# Patient Record
Sex: Female | Born: 1985
Health system: Southern US, Community
[De-identification: ages and names within clinical notes are randomized; demographics above are authoritative.]

## PROBLEM LIST (undated history)

## (undated) DIAGNOSIS — K219 Gastro-esophageal reflux disease without esophagitis: Secondary | ICD-10-CM

## (undated) DIAGNOSIS — K8050 Calculus of bile duct without cholangitis or cholecystitis without obstruction: Secondary | ICD-10-CM

## (undated) DIAGNOSIS — R1011 Right upper quadrant pain: Secondary | ICD-10-CM

## (undated) DIAGNOSIS — R7989 Other specified abnormal findings of blood chemistry: Secondary | ICD-10-CM

## (undated) DIAGNOSIS — D573 Sickle-cell trait: Secondary | ICD-10-CM

## (undated) DIAGNOSIS — U071 COVID-19: Secondary | ICD-10-CM

## (undated) DIAGNOSIS — R945 Abnormal results of liver function studies: Secondary | ICD-10-CM

## (undated) HISTORY — DX: Sickle-cell trait: D57.3

## (undated) HISTORY — DX: COVID-19: U07.1

## (undated) HISTORY — PX: NO PAST SURGERIES: SHX2092

## (undated) HISTORY — DX: Calculus of bile duct without cholangitis or cholecystitis without obstruction: K80.50

---

## 2015-06-23 LAB — HM PAP SMEAR: HM Pap smear: NEGATIVE

## 2015-11-27 ENCOUNTER — Encounter: Payer: Self-pay | Admitting: Family Medicine

## 2015-11-27 ENCOUNTER — Ambulatory Visit (INDEPENDENT_AMBULATORY_CARE_PROVIDER_SITE_OTHER): Payer: BLUE CROSS/BLUE SHIELD | Admitting: Family Medicine

## 2015-11-27 VITALS — BP 103/70 | HR 69 | Temp 98.3°F | Resp 100 | Ht 65.4 in | Wt 145.0 lb

## 2015-11-27 DIAGNOSIS — Z23 Encounter for immunization: Secondary | ICD-10-CM

## 2015-11-27 DIAGNOSIS — Z3041 Encounter for surveillance of contraceptive pills: Secondary | ICD-10-CM | POA: Diagnosis not present

## 2015-11-27 NOTE — Assessment & Plan Note (Signed)
Doing well on the pill. Would like to continue with it. Doesn't remember the name of it. Coming back this afternoon with her son, will tell us the name of her OCP at that time and we will give her refills. Continue to monitor.

## 2015-11-27 NOTE — Progress Notes (Signed)
BP 103/70 mmHg  Pulse 69  Temp(Src) 98.3 F (36.8 C)  Resp 100  Ht 5' 5.4" (1.661 m)  Wt 145 lb (65.772 kg)  BMI 23.84 kg/m2  LMP 10/27/2015 (Approximate)   Subjective:    Patient ID: Dawn Morgan, female    DOB: 05/09/1986, 30 y.o.   MRN: [id number]  HPI: Dawn Morgan is a 30 y.o. female who presents today to establish care.   Chief Complaint  Patient presents with  . Establish Care   CONTRACEPTION CONCERNS Contraception:  pill Previous contraception: only pill  Sexual activity: monagamous Gravida/Para: G3P3 Menarche at age: 35/13 Average interval between menses: 28 between  Length of menses: 4-5 days Flow: moderate Dysmenorrhea: yes  Last physical last year, unsure when her last pap was  Active Ambulatory Problems    Diagnosis Date Noted  . Encounter for surveillance of contraceptive pills 11/27/2015   Resolved Ambulatory Problems    Diagnosis Date Noted  . No Resolved Ambulatory Problems   No Additional Past Medical History   No Known Allergies  History reviewed. No pertinent past surgical history.  Family History  Problem Relation Age of Onset  . Diabetes Father   . Arthritis Maternal Grandmother    Social History   Social History  . Marital Status: Married    Spouse Name: N/A  . Number of Children: N/A  . Years of Education: N/A   Social History Main Topics  . Smoking status: Never Smoker   . Smokeless tobacco: Never Used  . Alcohol Use: Yes     Comment: On occasion  . Drug Use: No  . Sexual Activity: Yes    Birth Control/ Protection: Pill   Other Topics Concern  . None   Social History Narrative  . None    Review of Systems  Constitutional: Negative.   HENT: Negative.   Respiratory: Negative.   Cardiovascular: Negative.   Psychiatric/Behavioral: Negative.     Per HPI unless specifically indicated above     Objective:    BP 103/70 mmHg  Pulse 69  Temp(Src) 98.3 F (36.8 C)  Resp 100  Ht 5' 5.4" (1.661 m)  Wt 145 lb  (65.772 kg)  BMI 23.84 kg/m2  LMP 10/27/2015 (Approximate)  Wt Readings from Last 3 Encounters:  11/27/15 145 lb (65.772 kg)    Physical Exam  Constitutional: She is oriented to person, place, and time. She appears well-developed and well-nourished. No distress.  HENT:  Head: Normocephalic and atraumatic.  Right Ear: Hearing normal.  Left Ear: Hearing normal.  Nose: Nose normal.  Eyes: Conjunctivae and lids are normal. Right eye exhibits no discharge. Left eye exhibits no discharge. No scleral icterus.  Cardiovascular: Normal rate, regular rhythm, normal heart sounds and intact distal pulses.  Exam reveals no gallop and no friction rub.   No murmur heard. Pulmonary/Chest: Effort normal and breath sounds normal. No respiratory distress. She has no wheezes. She has no rales. She exhibits no tenderness.  Musculoskeletal: Normal range of motion.  Neurological: She is alert and oriented to person, place, and time.  Skin: Skin is warm, dry and intact. No rash noted. No erythema. No pallor.  Psychiatric: She has a normal mood and affect. Her speech is normal and behavior is normal. Judgment and thought content normal. Cognition and memory are normal.  Nursing note and vitals reviewed.   No results found for this or any previous visit.    Assessment & Plan:   Problem List Items Addressed This Visit  Other   Encounter for surveillance of contraceptive pills - Primary    Doing well on the pill. Would like to continue with it. Doesn't remember the name of it. Coming back this afternoon with her son, will tell us the name of her OCP at that time and we will give her refills. Continue to monitor.        Other Visit Diagnoses    Immunization due        Flu shot given today.    Relevant Orders    Flu Vaccine QUAD 36+ mos PF IM (Fluarix & Fluzone Quad PF) (Completed)        Follow up plan: Return in about 6 months (around 05/26/2016) for Physical- records release please  .

## 2015-11-28 MED ORDER — LEVONORGESTREL-ETHINYL ESTRAD 0.1-20 MG-MCG PO TABS: 1.0000 | ORAL_TABLET | Freq: Every day | ORAL | Status: DC

## 2015-11-28 NOTE — Addendum Note (Signed)
Addended by: Dorcas Carrow on: 11/28/2015 04:27 PM   Modules accepted: Orders

## 2015-11-30 ENCOUNTER — Encounter: Payer: Self-pay | Admitting: Family Medicine

## 2015-12-13 ENCOUNTER — Telehealth: Payer: Self-pay | Admitting: Family Medicine

## 2015-12-13 NOTE — Telephone Encounter (Signed)
Dawn Morgan: Please let patient know that she will need an appointment for that, or she can try over the counter acid reflux medication, and see if that helps before she schedules.

## 2015-12-13 NOTE — Telephone Encounter (Signed)
Pt is having pain in her stomach and she thinks it may be from acid reflux and she would like to have something called in to [facility].

## 2016-01-12 ENCOUNTER — Encounter: Payer: Self-pay | Admitting: Family Medicine

## 2016-01-12 ENCOUNTER — Ambulatory Visit (INDEPENDENT_AMBULATORY_CARE_PROVIDER_SITE_OTHER): Payer: BLUE CROSS/BLUE SHIELD | Admitting: Family Medicine

## 2016-01-12 ENCOUNTER — Ambulatory Visit: Payer: BLUE CROSS/BLUE SHIELD | Admitting: Family Medicine

## 2016-01-12 VITALS — BP 112/78 | HR 80 | Temp 98.7°F | Ht 65.6 in | Wt 145.0 lb

## 2016-01-12 DIAGNOSIS — K219 Gastro-esophageal reflux disease without esophagitis: Secondary | ICD-10-CM | POA: Diagnosis not present

## 2016-01-12 MED ORDER — OMEPRAZOLE 40 MG PO CPDR: 40.0000 mg | DELAYED_RELEASE_CAPSULE | Freq: Every day | ORAL | Status: DC

## 2016-01-12 NOTE — Assessment & Plan Note (Signed)
Will treat with omeprazole. Information given Call with any problems. Recheck 6 weeks.

## 2016-01-12 NOTE — Progress Notes (Signed)
BP 112/78 mmHg  Pulse 80  Temp(Src) 98.7 F (37.1 C)  Ht 5' 5.6" (1.666 m)  Wt 145 lb (65.772 kg)  BMI 23.70 kg/m2  SpO2 100%  LMP 12/25/2015 (Approximate)   Subjective:    Patient ID: Dawn Morgan, female    DOB: [birth year], 30 y.o.   MRN: [id number]  HPI: Dawn Morgan is a 30 y.o. female  Chief Complaint  Patient presents with  . Gastroesophageal Reflux    Patient states that she is having pain right below her breast in the center of her chest, she is also burping a lot. She has tried otc antiacids and they have not helped.   GERD, has been on and off, has been going on more recently GERD control status: exacerbated  Satisfied with current treatment? no Heartburn frequency: daily Medication side effects: no  Medication compliance: worse Previous GERD medications: Tums, antacid, zantac Antacid use frequency:  daily Duration: constant since Monday Nature: burning Location: Substernal Heartburn duration: days Alleviatiating factors:  nothing Aggravating factors: nothing Dysphagia: no Odynophagia:  no Hematemesis: no Blood in stool: no EGD: no  Relevant past medical, surgical, family and social history reviewed and updated as indicated. Interim medical history since our last visit reviewed. Allergies and medications reviewed and updated.  Review of Systems  Constitutional: Negative.   Respiratory: Negative.   Cardiovascular: Negative.   Gastrointestinal: Positive for abdominal pain. Negative for nausea, vomiting, diarrhea, constipation, blood in stool, abdominal distention, anal bleeding and rectal pain.  Psychiatric/Behavioral: Negative.     Per HPI unless specifically indicated above     Objective:    BP 112/78 mmHg  Pulse 80  Temp(Src) 98.7 F (37.1 C)  Ht 5' 5.6" (1.666 m)  Wt 145 lb (65.772 kg)  BMI 23.70 kg/m2  SpO2 100%  LMP 12/25/2015 (Approximate)  Wt Readings from Last 3 Encounters:  01/12/16 145 lb (65.772 kg)  11/27/15 145 lb (65.772 kg)     Physical Exam  Constitutional: She is oriented to person, place, and time. She appears well-developed and well-nourished. No distress.  HENT:  Head: Normocephalic and atraumatic.  Right Ear: Hearing normal.  Left Ear: Hearing normal.  Nose: Nose normal.  Eyes: Conjunctivae and lids are normal. Right eye exhibits no discharge. Left eye exhibits no discharge. No scleral icterus.  Pulmonary/Chest: Effort normal. No respiratory distress.  Abdominal: Soft. She exhibits no distension and no mass. There is tenderness in the epigastric area. There is no rebound and no guarding.  Musculoskeletal: Normal range of motion.  Neurological: She is alert and oriented to person, place, and time.  Skin: Skin is warm, dry and intact. No rash noted. She is not diaphoretic. No erythema. No pallor.  Psychiatric: She has a normal mood and affect. Her speech is normal and behavior is normal. Judgment and thought content normal. Cognition and memory are normal.  Nursing note and vitals reviewed.   Results for orders placed or performed in visit on 11/30/15  HM PAP SMEAR  Result Value Ref Range   HM Pap smear Neg Pap with Neg HPV       Assessment & Plan:   Problem List Items Addressed This Visit      Digestive   GERD (gastroesophageal reflux disease) - Primary    Will treat with omeprazole. Information given Call with any problems. Recheck 6 weeks.       Relevant Medications   omeprazole (PRILOSEC) 40 MG capsule       Follow up plan: Return in  about 6 weeks (around 02/23/2016) for Recheck GERD.

## 2016-01-12 NOTE — Patient Instructions (Signed)
Food Choices for Gastroesophageal Reflux Disease, Adult When you have gastroesophageal reflux disease (GERD), the foods you eat and your eating habits are very important. Choosing the right foods can help ease the discomfort of GERD. WHAT GENERAL GUIDELINES DO I NEED TO FOLLOW?  Choose fruits, vegetables, whole grains, low-fat dairy products, and low-fat meat, fish, and poultry.  Limit fats such as oils, salad dressings, butter, nuts, and avocado.  Keep a food diary to identify foods that cause symptoms.  Avoid foods that cause reflux. These may be different for different people.  Eat frequent small meals instead of three large meals each day.  Eat your meals slowly, in a relaxed setting.  Limit fried foods.  Cook foods using methods other than frying.  Avoid drinking alcohol.  Avoid drinking large amounts of liquids with your meals.  Avoid bending over or lying down until 2-3 hours after eating. WHAT FOODS ARE NOT RECOMMENDED? The following are some foods and drinks that may worsen your symptoms: Vegetables Tomatoes. Tomato juice. Tomato and spaghetti sauce. Chili peppers. Onion and garlic. Horseradish. Fruits Oranges, grapefruit, and lemon (fruit and juice). Meats High-fat meats, fish, and poultry. This includes hot dogs, ribs, ham, sausage, salami, and bacon. Dairy Whole milk and chocolate milk. Sour cream. Cream. Butter. Ice cream. Cream cheese.  Beverages Coffee and tea, with or without caffeine. Carbonated beverages or energy drinks. Condiments Hot sauce. Barbecue sauce.  Sweets/Desserts Chocolate and cocoa. Donuts. Peppermint and spearmint. Fats and Oils High-fat foods, including French fries and potato chips. Other Vinegar. Strong spices, such as black pepper, white pepper, red pepper, cayenne, curry powder, cloves, ginger, and chili powder. The items listed above may not be a complete list of foods and beverages to avoid. Contact your dietitian for more  information.   This information is not intended to replace advice given to you by your health care provider. Make sure you discuss any questions you have with your health care provider.   Document Released: 09/23/2005 Document Revised: 10/14/2014 Document Reviewed: 07/28/2013 Elsevier Interactive Patient Education 2016 Elsevier Inc.  

## 2016-02-20 ENCOUNTER — Encounter: Payer: Self-pay | Admitting: Unknown Physician Specialty

## 2016-02-20 ENCOUNTER — Ambulatory Visit (INDEPENDENT_AMBULATORY_CARE_PROVIDER_SITE_OTHER): Payer: BLUE CROSS/BLUE SHIELD | Admitting: Unknown Physician Specialty

## 2016-02-20 VITALS — BP 105/71 | HR 76 | Temp 97.9°F | Ht 66.5 in | Wt 143.2 lb

## 2016-02-20 DIAGNOSIS — R17 Unspecified jaundice: Secondary | ICD-10-CM

## 2016-02-20 DIAGNOSIS — B360 Pityriasis versicolor: Secondary | ICD-10-CM

## 2016-02-20 NOTE — Patient Instructions (Signed)
Buy Clotrimazole cream OTC for rash

## 2016-02-20 NOTE — Progress Notes (Signed)
BP 105/71 mmHg  Pulse 76  Temp(Src) 97.9 F (36.6 C)  Ht 5' 6.5" (1.689 m)  Wt 143 lb 3.2 oz (64.955 kg)  BMI 22.77 kg/m2  SpO2 99%  LMP 02/02/2016 (Approximate)   Subjective:    Patient ID: Dawn Morgan, female    DOB: 1985/11/04, 30 y.o.   MRN: [id number]  HPI: Dawn NovemberLaure Quinton is a 30 y.o. female  Chief Complaint  Patient presents with  . Gastroesophageal Reflux    pt states she has been having trouble with GERD off and on for a while but for the last week it has been consistant, states the omeprazole is not really helping   Pt is here to f/u form last visit in which she was put back on Omeprazole.  She is finding this is not helping.  She is working on diet and has a low appetite.  She is having intermittent abdominal pain, sometimes feels severe.  Points to RUQ and epigastric areas.  Some nausea and decreased appetite.  States she feels tired.  Reports urine is more yellow with clay colored stools.  No recent travel or sexual contacts.  Eats out about once a week.    Relevant past medical, surgical, family and social history reviewed and updated as indicated. Interim medical history since our last visit reviewed. Allergies and medications reviewed and updated.  Review of Systems  Constitutional: Positive for fatigue.  Respiratory: Negative.   Cardiovascular: Negative.   Gastrointestinal:       Clay colored stools  Skin: Negative.        Spots on back that are new and itch  Psychiatric/Behavioral: Negative.     Per HPI unless specifically indicated above     Objective:    BP 105/71 mmHg  Pulse 76  Temp(Src) 97.9 F (36.6 C)  Ht 5' 6.5" (1.689 m)  Wt 143 lb 3.2 oz (64.955 kg)  BMI 22.77 kg/m2  SpO2 99%  LMP 02/02/2016 (Approximate)  Wt Readings from Last 3 Encounters:  02/20/16 143 lb 3.2 oz (64.955 kg)  01/12/16 145 lb (65.772 kg)  11/27/15 145 lb (65.772 kg)    Physical Exam  Constitutional: She is oriented to person, place, and time. She appears  well-developed and well-nourished. No distress.  HENT:  Head: Normocephalic and atraumatic.  Eyes: Conjunctivae and lids are normal. Right eye exhibits no discharge. Left eye exhibits no discharge. Scleral icterus is present.  Neck: Normal range of motion. Neck supple. No JVD present. Carotid bruit is not present.  Cardiovascular: Normal rate, regular rhythm and normal heart sounds.   Pulmonary/Chest: Effort normal and breath sounds normal.  Abdominal: Soft. Normal appearance. There is hepatomegaly. There is no splenomegaly. There is tenderness in the left upper quadrant.  Musculoskeletal: Normal range of motion.  Neurological: She is alert and oriented to person, place, and time.  Skin: Skin is warm, dry and intact. No pallor.  Irregular darkened patches on back  Psychiatric: She has a normal mood and affect. Her behavior is normal. Judgment and thought content normal.      Assessment & Plan:   Problem List Items Addressed This Visit    None    Visit Diagnoses    Jaundice of recent onset    -  Primary    Get abdominal US.  To GI ASAP.  Labs.  Discussed pt Dr. Dossie Arbourrissman.      Relevant Orders    Comprehensive metabolic panel    CBC with Differential/Platelet    UKorea  Abdomen Complete    Hepatic function panel    Amylase    Lipase    Hepatitis panel, acute    Ambulatory referral to Gastroenterology    Tinea versicolor        Rx for Clotrimazome cream        Follow up plan: Return for To GI ASAP.

## 2016-02-21 ENCOUNTER — Encounter: Payer: Self-pay | Admitting: Gastroenterology

## 2016-02-21 ENCOUNTER — Other Ambulatory Visit (INDEPENDENT_AMBULATORY_CARE_PROVIDER_SITE_OTHER): Payer: BLUE CROSS/BLUE SHIELD

## 2016-02-21 ENCOUNTER — Ambulatory Visit (INDEPENDENT_AMBULATORY_CARE_PROVIDER_SITE_OTHER): Payer: BLUE CROSS/BLUE SHIELD | Admitting: Gastroenterology

## 2016-02-21 VITALS — BP 90/60 | HR 80 | Ht 66.25 in | Wt 142.0 lb

## 2016-02-21 DIAGNOSIS — R945 Abnormal results of liver function studies: Principal | ICD-10-CM

## 2016-02-21 DIAGNOSIS — R1011 Right upper quadrant pain: Secondary | ICD-10-CM | POA: Diagnosis not present

## 2016-02-21 DIAGNOSIS — R7989 Other specified abnormal findings of blood chemistry: Secondary | ICD-10-CM

## 2016-02-21 LAB — HEPATIC FUNCTION PANEL
ALT: 437 U/L — AB (ref 0–35)
AST: 155 U/L — ABNORMAL HIGH (ref 0–37)
Albumin: 4.4 g/dL (ref 3.5–5.2)
Alkaline Phosphatase: 191 U/L — ABNORMAL HIGH (ref 39–117)
BILIRUBIN DIRECT: 0.8 mg/dL — AB (ref 0.0–0.3)
BILIRUBIN TOTAL: 1.8 mg/dL — AB (ref 0.2–1.2)
Bilirubin, Direct: 1.17 mg/dL — ABNORMAL HIGH (ref 0.00–0.40)
Total Protein: 7.6 g/dL (ref 6.0–8.3)

## 2016-02-21 LAB — CBC WITH DIFFERENTIAL/PLATELET
BASOS: 1 %
Basophils Absolute: 0 10*3/uL (ref 0.0–0.2)
EOS (ABSOLUTE): 0.1 10*3/uL (ref 0.0–0.4)
EOS: 1 %
HEMATOCRIT: 35.2 % (ref 34.0–46.6)
HEMOGLOBIN: 11.2 g/dL (ref 11.1–15.9)
Immature Grans (Abs): 0 10*3/uL (ref 0.0–0.1)
Immature Granulocytes: 0 %
LYMPHS ABS: 1.1 10*3/uL (ref 0.7–3.1)
Lymphs: 27 %
MCH: 26.5 pg — AB (ref 26.6–33.0)
MCHC: 31.8 g/dL (ref 31.5–35.7)
MCV: 83 fL (ref 79–97)
MONOCYTES: 7 %
MONOS ABS: 0.3 10*3/uL (ref 0.1–0.9)
NEUTROS ABS: 2.7 10*3/uL (ref 1.4–7.0)
Neutrophils: 64 %
Platelets: 340 10*3/uL (ref 150–379)
RBC: 4.22 x10E6/uL (ref 3.77–5.28)
RDW: 15.3 % (ref 12.3–15.4)
WBC: 4.2 10*3/uL (ref 3.4–10.8)

## 2016-02-21 LAB — COMPREHENSIVE METABOLIC PANEL
ALBUMIN: 4.1 g/dL (ref 3.5–5.5)
ALK PHOS: 226 IU/L — AB (ref 39–117)
ALT: 515 IU/L (ref 0–32)
AST: 247 IU/L — ABNORMAL HIGH (ref 0–40)
Albumin/Globulin Ratio: 1.5 (ref 1.2–2.2)
BUN / CREAT RATIO: 14 (ref 9–23)
BUN: 11 mg/dL (ref 6–20)
Bilirubin Total: 2.2 mg/dL — ABNORMAL HIGH (ref 0.0–1.2)
CO2: 24 mmol/L (ref 18–29)
CREATININE: 0.81 mg/dL (ref 0.57–1.00)
Calcium: 9.2 mg/dL (ref 8.7–10.2)
Chloride: 101 mmol/L (ref 96–106)
GFR, EST AFRICAN AMERICAN: 112 mL/min/{1.73_m2} (ref >59)
GFR, EST NON AFRICAN AMERICAN: 97 mL/min/{1.73_m2} (ref >59)
GLOBULIN, TOTAL: 2.8 g/dL (ref 1.5–4.5)
GLUCOSE: 87 mg/dL (ref 65–99)
Potassium: 4.7 mmol/L (ref 3.5–5.2)
SODIUM: 140 mmol/L (ref 134–144)
TOTAL PROTEIN: 6.9 g/dL (ref 6.0–8.5)

## 2016-02-21 LAB — HEPATITIS PANEL, ACUTE
HEP B C IGM: NEGATIVE
HEP B S AG: NEGATIVE
HEP C VIRUS AB: 0.1 {s_co_ratio} (ref 0.0–0.9)
Hep A IgM: NEGATIVE

## 2016-02-21 LAB — LIPASE: Lipase: 85 U/L — ABNORMAL HIGH (ref 0–59)

## 2016-02-21 LAB — PROTIME-INR
INR: 1 ratio (ref 0.8–1.0)
PROTHROMBIN TIME: 10.2 s (ref 9.6–13.1)

## 2016-02-21 LAB — AMYLASE: AMYLASE: 102 U/L (ref 31–124)

## 2016-02-21 NOTE — Progress Notes (Signed)
Reviewed and agree with initial management plan.  Christion Leonhard T. Eldine Rencher, MD FACG 

## 2016-02-21 NOTE — Patient Instructions (Signed)
Please go to the basement level to have your labs drawn.  You have been scheduled for an abdominal ultrasound [facility]  Tomorrow 5-18  at 10:30 am. Please arrive at 10: 15 minutes prior to your appointment for registration. Make certain not to have anything to eat or drink after midnight  prior to your appointment. Should you need to reschedule your appointment, please contact radiology at [phone]. This test typically takes about 30 minutes to perform.

## 2016-02-21 NOTE — Progress Notes (Signed)
02/21/2016 Stephens NovemberLaure Morgan [id number] 1986/05/08   HISTORY OF PRESENT ILLNESS:  This is a very pleasant 30 year old female who is here today with her husband at the request of her PCP, Gabriel Cirriheryl Wicker NP, for evaluation of abdominal pain with associated elevated liver function tests.  She tells me that last week she developed intense epigastric and right upper quadrant abdominal pain. She had a few episodes of nausea and vomiting as well. She thought that this was acid reflux because she had a couple of similar episodes in the past that were contributed to acid reflux. When she developed this pain last week it was very intense so she saw her PCP finally yesterday, however, the pain was already improved. CBC, amylase, electrolytes, and renal function were all normal.  LFTs are elevated with a total bilirubin of 2.2, alkaline phosphatase 226, AST 247, ALT 515. Scheduled for an ultrasound next week. She tells me that today her upper abdomen is just sore, but nowhere near the intensity that it was last week. Once again, she's had another episode about a month ago and one last year that felt very similar. She was recently started on omeprazole 40 mg daily.  Viral hepatitis studies were negative. She denies any recent travel. She denies any use of herbals, supplements, vitamin/minerals, etc.  Past Medical History  Diagnosis Date  . Sickle cell trait (HCC)   . Anemia in pregnancy   . Iron deficiency anemia    Past Surgical History  Procedure Laterality Date  . Neg hx      reports that she has never smoked. She has never used smokeless tobacco. She reports that she drinks alcohol. She reports that she does not use illicit drugs. family history includes Arthritis in her maternal grandmother; Diabetes in her father. There is no history of Colon cancer. No Known Allergies    Outpatient Encounter Prescriptions as of 02/21/2016  Medication Sig  . levonorgestrel-ethinyl estradiol (AVIANE,ALESSE,LESSINA)  0.1-20 MG-MCG tablet Take 1 tablet by mouth daily.  Marland Kitchen. omeprazole (PRILOSEC) 40 MG capsule Take 1 capsule (40 mg total) by mouth daily.   No facility-administered encounter medications on file as of 02/21/2016.     REVIEW OF SYSTEMS  : All other systems reviewed and negative except where noted in the History of Present Illness.   PHYSICAL EXAM: BP 90/60 mmHg  Pulse 80  Ht 5' 6.25" (1.683 m)  Wt 142 lb (64.411 kg)  BMI 22.74 kg/m2  LMP 02/02/2016 (Approximate) General: Well developed black female in no acute distress Head: Normocephalic and atraumatic Eyes:  Sclerae anicteric, conjunctiva pink. Ears: Normal auditory acuity Lungs: Clear throughout to auscultation Heart: Regular rate and rhythm Abdomen: Soft, non-distended.  Normal bowel sounds.  Mild epigastric and RUQ TTP. Musculoskeletal: Symmetrical with no gross deformities  Skin: No lesions on visible extremities Extremities: No edema  Neurological: Alert oriented x 4, grossly non-focal Psychological:  Alert and cooperative. Normal mood and affect  ASSESSMENT AND PLAN: -30 year old female with elevated LFTs and complaints of epigastric/right upper quadrant abdominal pain. Pain now improved.  She's had episodes of similar pain in the past as well. My thoughts are that she likely has gallstones and could be passing stones intermittently. Acute viral hepatitis panel is negative. She has no other associated viral symptoms that would prompt concern for EBV or CMV. She is scheduled for ultrasound next week, but we will see if we can get that done sooner.  We will recheck a hepatic function panel today along  with a PT/INR and autoimmune hepatitis studies, although I feel that this is less likely a primary liver issue.  CC:  Dawn Perches P, DO

## 2016-02-22 ENCOUNTER — Telehealth: Payer: Self-pay | Admitting: *Deleted

## 2016-02-22 ENCOUNTER — Ambulatory Visit
Admission: RE | Admit: 2016-02-22 | Discharge: 2016-02-22 | Disposition: A | Discharge status: Home / Self Care | Payer: BLUE CROSS/BLUE SHIELD | Source: Ambulatory Visit | Attending: Gastroenterology | Admitting: Gastroenterology

## 2016-02-22 DIAGNOSIS — K8020 Calculus of gallbladder without cholecystitis without obstruction: Secondary | ICD-10-CM | POA: Diagnosis not present

## 2016-02-22 DIAGNOSIS — R7989 Other specified abnormal findings of blood chemistry: Secondary | ICD-10-CM | POA: Diagnosis not present

## 2016-02-22 DIAGNOSIS — R945 Abnormal results of liver function studies: Secondary | ICD-10-CM

## 2016-02-22 DIAGNOSIS — R1011 Right upper quadrant pain: Secondary | ICD-10-CM | POA: Diagnosis not present

## 2016-02-22 DIAGNOSIS — R938 Abnormal findings on diagnostic imaging of other specified body structures: Secondary | ICD-10-CM | POA: Diagnosis not present

## 2016-02-22 LAB — ANA: ANA: NEGATIVE

## 2016-02-22 NOTE — Telephone Encounter (Signed)
Called patient to inform her that Doug SouJessica Zehr PA spoke with one of the surgeons at [facility] and they made her an appointment to be seen in their office tomorrow 02-23-2016.  She is to arrive at 9:45 am. She will be seeing Dr. Magnus IvanKingsinger. I faxed them today the patient's demographics, labs, recent US results from yesterday, 5-17, and office notes from HallettJessica Zehr PA from 5-17.  I advise the doctor will review all this information and evaluate her situation. He will advise her of his plans.

## 2016-02-23 ENCOUNTER — Ambulatory Visit: Payer: BLUE CROSS/BLUE SHIELD | Admitting: Family Medicine

## 2016-02-23 ENCOUNTER — Ambulatory Visit: Payer: Self-pay | Admitting: General Surgery

## 2016-02-23 ENCOUNTER — Telehealth: Payer: Self-pay | Admitting: Gastroenterology

## 2016-02-23 DIAGNOSIS — K8050 Calculus of bile duct without cholangitis or cholecystitis without obstruction: Secondary | ICD-10-CM | POA: Diagnosis not present

## 2016-02-23 DIAGNOSIS — K8020 Calculus of gallbladder without cholecystitis without obstruction: Secondary | ICD-10-CM | POA: Diagnosis not present

## 2016-02-23 LAB — ANTI-SMOOTH MUSCLE ANTIBODY, IGG: SMOOTH MUSCLE AB: 40 U — AB (ref <20)

## 2016-02-23 LAB — MITOCHONDRIAL ANTIBODIES

## 2016-02-23 NOTE — H&P (Signed)
History of Present Illness Dawn Morgan(Dawn A. Kinsinger MD; 02/23/2016 11:38 AM) Patient words: gallbladder.  The patient is a 30 year old female who presents for evaluation of gall stones. Referred by Dawn SouJessica Morgan. Patient has a one-year history of intermittent epigastric abdominal pain associated with reflux. She's been on Prilosec for this for a long time with moderate control. In the past month she has had worsening abdominal pain in the specifically right upper quadrant. She does not associate this with any new foods but has tried to maintain a low acid and low spice diet. Last week she had very severe right upper quadrant abdominal pain and was referred to GI who underwent ultrasound showing large gallstones and a slightly dilated common bile duct. She initially had a weighted total bilirubin but that has not decreased. She also had nausea and vomiting multiple times as well as loss of appetite. In the last 5 days she has had much less abdominal pain and is not had much appetite but maintaining a bland diet.  She has had no other abdominal surgeries. She's had 3 pregnancies. She does not smoke.   Other Problems Dawn Morgan(Dawn Dockery, LPN; 4/09/81195/19/2017 14:7811:03 AM) Gastroesophageal Reflux Disease  Past Surgical History Dawn Morgan(Dawn Dockery, LPN; 2/95/62135/19/2017 08:6511:03 AM) No pertinent past surgical history  Diagnostic Studies History Dawn Morgan(Dawn Dockery, LPN; 7/84/69625/19/2017 95:2811:03 AM) Mammogram never Pap Smear 1-5 years ago  Allergies Dawn Morgan(Dawn Dockery, LPN; 4/13/24405/19/2017 10:2711:04 AM) No Known Drug Allergies05/19/2017  Medication History Dawn Morgan(Dawn Dockery, LPN; 2/53/66445/19/2017 03:4711:04 AM) Omeprazole (40MG  Capsule DR, Oral) Active. Orsythia (0.1-20MG -MCG Tablet, Oral) Active. Medications Reconciled  Pregnancy / Birth History Dawn Morgan(Dawn Dockery, LPN; 4/25/95635/19/2017 87:5611:03 AM) Age at menarche 10 years. Contraceptive History Oral contraceptives. Maternal age 32-20    Review of Systems Dawn Morgan(Dawn A. Kinsinger MD; 02/23/2016 11:39 AM) General  Present- Appetite Loss. Not Present- Chills, Fatigue, Fever, Night Sweats, Weight Gain and Weight Loss. Skin Present- Jaundice. Not Present- Change in Wart/Mole, Dryness, Hives, New Lesions, Non-Healing Wounds, Rash and Ulcer. Neck Not Present- Neck Mass, Neck Pain and Neck Stiffness. Respiratory Not Present- Cough, Decreased Exercise Tolerance, Difficulty Breathing and Difficulty Breathing on Exertion. Cardiovascular Present- Chest Pain. Not Present- Difficulty Breathing Lying Down, Leg Cramps, Palpitations, Rapid Heart Rate, Shortness of Breath and Swelling of Extremities. Gastrointestinal Present- Abdominal Pain, Bloating and Indigestion. Not Present- Bloody Stool, Change in Bowel Habits, Chronic diarrhea, Constipation, Difficulty Swallowing, Excessive gas, Gets full quickly at meals, Hemorrhoids, Nausea, Rectal Pain and Vomiting. Musculoskeletal Not Present- Back Pain, Backache, Calf Pain and Claudication. Neurological Not Present- Decreased Memory, Difficulty Speaking, Dizziness, Dysarthria and Dysesthesia. Psychiatric Present- Change in Sleep Pattern. Not Present- Anxiety, Bipolar and Delirium. Hematology Not Present- Abnormal Bleeding, Anemia, Blood Clots and Blood Thinners.  Vitals Dawn Morgan(Dawn Dockery LPN; 4/33/29515/19/2017 88:4111:04 AM) 02/23/2016 11:04 AM Weight: 144.4 lb Height: 66in Body Surface Area: 1.74 m Body Mass Index: 23.31 kg/m  Temp.: 98.64F(Oral)  Pulse: 72 (Regular)  BP: 116/68 (Sitting, Left Arm, Standard)       Physical Exam Dawn Morgan(Dawn A Kinsinger, MD; 02/23/2016 11:40 AM) General Mental Status-Alert. General Appearance-Cooperative. Orientation-Oriented X4. Posture-Normal posture.  Integumentary Global Assessment Normal Exam - Head/Face: no rashes, ulcers, lesions or evidence of photo damage. No palpable nodules or masses and Neck: no visible lesions or palpable masses.  Head and Neck Head-normocephalic, atraumatic with no lesions or palpable  masses. Face Global Assessment - atraumatic. Thyroid Gland Characteristics - normal size and consistency.  Eye Eyeball - Bilateral-Extraocular movements intact. Sclera/Conjunctiva - Bilateral-No scleral icterus, No Discharge.  ENMT  Nose and Sinuses Nose - no deformities observed, no swelling present.  Chest and Lung Exam Palpation Normal exam - Non-tender. Auscultation Breath sounds - Normal.  Cardiovascular Auscultation Rhythm - Regular. Heart Sounds - S1 WNL and S2 WNL. Carotid arteries - No Carotid bruit.  Abdomen Inspection Normal Exam - No Visible peristalsis, No Abnormal pulsations and No Paradoxical movements. Palpation/Percussion Normal exam - Soft, No Rebound tenderness, No Rigidity (guarding), No hepatosplenomegaly and No Palpable abdominal masses. Tenderness - Right Upper Quadrant. Gallbladder - Negative Murphy's sign.  Peripheral Vascular Upper Extremity Palpation - Pulses bilaterally normal. Lower Extremity Palpation - Edema - Bilateral - No edema.  Neurologic Neurologic evaluation reveals -normal sensation and normal coordination.  Neuropsychiatric Mental status exam performed with findings of-able to articulate well with normal speech/language, rate, volume and coherence and thought content normal with ability to perform basic computations and apply abstract reasoning.  Musculoskeletal Normal Exam - Bilateral-Upper Extremity Strength Normal and Lower Extremity Strength Normal.    Assessment & Plan Dawn Carl MD; 02/23/2016 11:40 AM) GALL BLADDER STONES (K80.20) CHOLEDOCHOLITHIASIS (K80.50) Impression: 30 year old female with two-week history of present quadrant abdominal pain and findings of 2.5 cm gallstones as well as common bile duct dilation to 7 mm and bilirubin up to 2.2. This is consistent with recent history of choledocholithiasis based off of decreased bilirubin decreased pain likely has passed. Due to consultation of  gallstone-related disease I recommended urgent cholecystectomy. We discussed the etiology of gallstones and probably cause pain. We discussed exacerbating factors including fatty meals. We discussed the details of surgery for removal of the gallbladder including general anesthesia, 4 small incisions in the patient's abdomen, removal of the patient's gallbladder with the liver and common bile duct, and most likely outpatient procedure. We discussed risks of common bile duct injury, cystic duct stump leak, injury to liver, bleeding, infection, need for open procedure, and this cholecystectomy syndrome. The patient showed good understanding and wanted to proceed with laparoscopic cholecystectomy. Current Plans You are being scheduled for surgery - Our schedulers will call you.  You should hear from our office's scheduling department within 5 working days about the location, date, and time of surgery. We try to make accommodations for patient's preferences in scheduling surgery, but sometimes the OR schedule or the surgeon's schedule prevents [place] from making those accommodations.  If you have not heard from our office [phone]) in 5 working days, call the office and ask for your surgeon's nurse.  If you have other questions about your diagnosis, plan, or surgery, call the office and ask for your surgeon's nurse.  Pt Education - Pamphlet Given - Laparoscopic Gallbladder Surgery: discussed with patient and provided information. Pt Education - Laparoscopic Cholecystectomy: gallbladder The anatomy & physiology of hepatobiliary & pancreatic function was discussed. The pathophysiology of gallbladder dysfunction was discussed. Natural history risks without surgery was discussed. I feel the risks of no intervention will lead to serious problems that outweigh the operative risks; therefore, I recommended cholecystectomy to remove the pathology. I explained laparoscopic techniques with possible need for an open  approach. Probable cholangiogram to evaluate the bilary tract was explained as well.  Risks such as bleeding, infection, abscess, leak, injury to other organs, need for further treatment, heart attack, death, and other risks were discussed. I noted a good likelihood this will help address the problem. Possibility that this will not correct all abdominal symptoms was explained. Goals of post-operative recovery were discussed as well. We will work to minimize complications. An  educational handout further explaining the pathology and treatment options was given as well. Questions were answered. The patient expresses understanding & wishes to proceed with surgery.

## 2016-02-26 ENCOUNTER — Encounter (HOSPITAL_BASED_OUTPATIENT_CLINIC_OR_DEPARTMENT_OTHER): Payer: Self-pay | Admitting: *Deleted

## 2016-02-26 NOTE — Progress Notes (Signed)
NPO AFTER MN WITH EXCEPTION CLEAR LIQUIDS UNTIL 0830 (NO CREAM /MILK PRODUCTS).  ARRIVE AT 1230.  NEEDS CBC W/ DIFF , CMET, AND URINE PREG.  WILL TAKE PRILOSEC AM DOS W/ SIPS OF WATER AND IF NEEDED TAKE TYLENOL .

## 2016-02-27 ENCOUNTER — Ambulatory Visit: Payer: BLUE CROSS/BLUE SHIELD

## 2016-03-01 ENCOUNTER — Encounter (HOSPITAL_COMMUNITY): Payer: Self-pay | Admitting: *Deleted

## 2016-03-05 ENCOUNTER — Ambulatory Visit (HOSPITAL_COMMUNITY): Payer: BLUE CROSS/BLUE SHIELD | Admitting: Anesthesiology

## 2016-03-05 ENCOUNTER — Telehealth: Payer: Self-pay | Admitting: Gastroenterology

## 2016-03-05 ENCOUNTER — Encounter (HOSPITAL_COMMUNITY): Payer: Self-pay | Admitting: *Deleted

## 2016-03-05 ENCOUNTER — Ambulatory Visit (HOSPITAL_COMMUNITY): Payer: BLUE CROSS/BLUE SHIELD

## 2016-03-05 ENCOUNTER — Observation Stay (HOSPITAL_COMMUNITY)
Admission: RE | Admit: 2016-03-05 | Discharge: 2016-03-06 | Disposition: A | Discharge status: Home / Self Care | Payer: BLUE CROSS/BLUE SHIELD | Source: Ambulatory Visit | Attending: General Surgery | Admitting: General Surgery

## 2016-03-05 ENCOUNTER — Encounter (HOSPITAL_COMMUNITY)
Admission: RE | Disposition: A | Discharge status: Home / Self Care | Payer: Self-pay | Source: Ambulatory Visit | Attending: General Surgery

## 2016-03-05 DIAGNOSIS — R1011 Right upper quadrant pain: Secondary | ICD-10-CM | POA: Diagnosis not present

## 2016-03-05 DIAGNOSIS — R932 Abnormal findings on diagnostic imaging of liver and biliary tract: Secondary | ICD-10-CM

## 2016-03-05 DIAGNOSIS — K219 Gastro-esophageal reflux disease without esophagitis: Secondary | ICD-10-CM | POA: Diagnosis not present

## 2016-03-05 DIAGNOSIS — K8045 Calculus of bile duct with chronic cholecystitis with obstruction: Secondary | ICD-10-CM | POA: Diagnosis not present

## 2016-03-05 DIAGNOSIS — D573 Sickle-cell trait: Secondary | ICD-10-CM | POA: Diagnosis not present

## 2016-03-05 DIAGNOSIS — K8010 Calculus of gallbladder with chronic cholecystitis without obstruction: Secondary | ICD-10-CM | POA: Diagnosis not present

## 2016-03-05 DIAGNOSIS — Z79899 Other long term (current) drug therapy: Secondary | ICD-10-CM | POA: Insufficient documentation

## 2016-03-05 DIAGNOSIS — K8020 Calculus of gallbladder without cholecystitis without obstruction: Secondary | ICD-10-CM

## 2016-03-05 DIAGNOSIS — K8050 Calculus of bile duct without cholangitis or cholecystitis without obstruction: Secondary | ICD-10-CM | POA: Diagnosis not present

## 2016-03-05 DIAGNOSIS — K8040 Calculus of bile duct with cholecystitis, unspecified, without obstruction: Secondary | ICD-10-CM | POA: Diagnosis not present

## 2016-03-05 DIAGNOSIS — K8042 Calculus of bile duct with acute cholecystitis without obstruction: Principal | ICD-10-CM | POA: Diagnosis present

## 2016-03-05 DIAGNOSIS — Z793 Long term (current) use of hormonal contraceptives: Secondary | ICD-10-CM | POA: Insufficient documentation

## 2016-03-05 HISTORY — DX: Calculus of bile duct without cholangitis or cholecystitis without obstruction: K80.50

## 2016-03-05 HISTORY — DX: Other specified abnormal findings of blood chemistry: R79.89

## 2016-03-05 HISTORY — PX: CHOLECYSTECTOMY: SHX55

## 2016-03-05 HISTORY — DX: Right upper quadrant pain: R10.11

## 2016-03-05 HISTORY — DX: Gastro-esophageal reflux disease without esophagitis: K21.9

## 2016-03-05 HISTORY — DX: Abnormal results of liver function studies: R94.5

## 2016-03-05 LAB — COMPREHENSIVE METABOLIC PANEL
ALBUMIN: 3.9 g/dL (ref 3.5–5.0)
ALT: 35 U/L (ref 14–54)
ANION GAP: 6 (ref 5–15)
AST: 21 U/L (ref 15–41)
Alkaline Phosphatase: 89 U/L (ref 38–126)
BILIRUBIN TOTAL: 1.3 mg/dL — AB (ref 0.3–1.2)
BUN: 9 mg/dL (ref 6–20)
CO2: 26 mmol/L (ref 22–32)
Calcium: 9.4 mg/dL (ref 8.9–10.3)
Chloride: 108 mmol/L (ref 101–111)
Creatinine, Ser: 0.76 mg/dL (ref 0.44–1.00)
GFR calc non Af Amer: >60 mL/min (ref >60)
GLUCOSE: 96 mg/dL (ref 65–99)
POTASSIUM: 3.4 mmol/L — AB (ref 3.5–5.1)
SODIUM: 140 mmol/L (ref 135–145)
TOTAL PROTEIN: 7.5 g/dL (ref 6.5–8.1)

## 2016-03-05 LAB — CBC WITH DIFFERENTIAL/PLATELET
BASOS PCT: 1 %
Basophils Absolute: 0 10*3/uL (ref 0.0–0.1)
EOS ABS: 0 10*3/uL (ref 0.0–0.7)
Eosinophils Relative: 1 %
HCT: 35 % — ABNORMAL LOW (ref 36.0–46.0)
Hemoglobin: 11.3 g/dL — ABNORMAL LOW (ref 12.0–15.0)
Lymphocytes Relative: 44 %
Lymphs Abs: 1.7 10*3/uL (ref 0.7–4.0)
MCH: 26.4 pg (ref 26.0–34.0)
MCHC: 32.3 g/dL (ref 30.0–36.0)
MCV: 81.8 fL (ref 78.0–100.0)
MONO ABS: 0.3 10*3/uL (ref 0.1–1.0)
MONOS PCT: 9 %
Neutro Abs: 1.7 10*3/uL (ref 1.7–7.7)
Neutrophils Relative %: 45 %
Platelets: 402 10*3/uL — ABNORMAL HIGH (ref 150–400)
RBC: 4.28 MIL/uL (ref 3.87–5.11)
RDW: 14.3 % (ref 11.5–15.5)
WBC: 3.8 10*3/uL — ABNORMAL LOW (ref 4.0–10.5)

## 2016-03-05 LAB — HCG, SERUM, QUALITATIVE: Preg, Serum: NEGATIVE

## 2016-03-05 SURGERY — LAPAROSCOPIC CHOLECYSTECTOMY WITH INTRAOPERATIVE CHOLANGIOGRAM
Anesthesia: General | Site: Abdomen

## 2016-03-05 MED ORDER — MIDAZOLAM HCL 5 MG/5ML IJ SOLN
INTRAMUSCULAR | Status: DC | PRN
  Administered 2016-03-05 (×2): 1 mg via INTRAVENOUS

## 2016-03-05 MED ORDER — DEXAMETHASONE SODIUM PHOSPHATE 10 MG/ML IJ SOLN
INTRAMUSCULAR | Status: AC
  Filled 2016-03-05: qty 1

## 2016-03-05 MED ORDER — BUPIVACAINE-EPINEPHRINE 0.25% -1:200000 IJ SOLN
INTRAMUSCULAR | Status: DC | PRN
  Administered 2016-03-05: 25 mL

## 2016-03-05 MED ORDER — ROCURONIUM BROMIDE 50 MG/5ML IV SOLN
INTRAVENOUS | Status: AC
  Filled 2016-03-05: qty 1

## 2016-03-05 MED ORDER — PHENYLEPHRINE 40 MCG/ML (10ML) SYRINGE FOR IV PUSH (FOR BLOOD PRESSURE SUPPORT)
PREFILLED_SYRINGE | INTRAVENOUS | Status: AC
  Filled 2016-03-05: qty 10

## 2016-03-05 MED ORDER — LIDOCAINE HCL (CARDIAC) 20 MG/ML IV SOLN
INTRAVENOUS | Status: DC | PRN
  Administered 2016-03-05: 60 mg via INTRAVENOUS

## 2016-03-05 MED ORDER — KETOROLAC TROMETHAMINE 30 MG/ML IJ SOLN
INTRAMUSCULAR | Status: AC
  Filled 2016-03-05: qty 1

## 2016-03-05 MED ORDER — GLYCOPYRROLATE 0.2 MG/ML IV SOSY
PREFILLED_SYRINGE | INTRAVENOUS | Status: AC
  Filled 2016-03-05: qty 3

## 2016-03-05 MED ORDER — FENTANYL CITRATE (PF) 250 MCG/5ML IJ SOLN
INTRAMUSCULAR | Status: AC
Start: 2016-03-05 — End: 2016-03-05
  Filled 2016-03-05: qty 5

## 2016-03-05 MED ORDER — NEOSTIGMINE METHYLSULFATE 10 MG/10ML IV SOLN
INTRAVENOUS | Status: DC | PRN
  Administered 2016-03-05: 4 mg via INTRAVENOUS

## 2016-03-05 MED ORDER — ARTIFICIAL TEARS OP OINT
TOPICAL_OINTMENT | OPHTHALMIC | Status: AC
  Filled 2016-03-05: qty 7

## 2016-03-05 MED ORDER — HYDROMORPHONE HCL 1 MG/ML IJ SOLN
0.2500 mg | INTRAMUSCULAR | Status: DC | PRN
  Administered 2016-03-05: 0.3 mg via INTRAVENOUS

## 2016-03-05 MED ORDER — PANTOPRAZOLE SODIUM 40 MG PO TBEC
40.0000 mg | DELAYED_RELEASE_TABLET | Freq: Every day | ORAL | Status: DC
  Administered 2016-03-05 – 2016-03-06 (×2): 40 mg via ORAL
  Filled 2016-03-05 (×3): qty 1

## 2016-03-05 MED ORDER — KETOROLAC TROMETHAMINE 30 MG/ML IJ SOLN
30.0000 mg | Freq: Once | INTRAMUSCULAR | Status: AC | PRN
  Administered 2016-03-05: 30 mg via INTRAVENOUS

## 2016-03-05 MED ORDER — HYDROMORPHONE HCL 1 MG/ML IJ SOLN
INTRAMUSCULAR | Status: AC
  Filled 2016-03-05: qty 1

## 2016-03-05 MED ORDER — PROPOFOL 10 MG/ML IV BOLUS
INTRAVENOUS | Status: AC
  Filled 2016-03-05: qty 20

## 2016-03-05 MED ORDER — OXYCODONE-ACETAMINOPHEN 5-325 MG PO TABS
1.0000 | ORAL_TABLET | ORAL | Status: DC | PRN
  Administered 2016-03-06 (×2): 1 via ORAL
  Filled 2016-03-05: qty 1
  Filled 2016-03-05: qty 2

## 2016-03-05 MED ORDER — SODIUM CHLORIDE 0.9 % IR SOLN
Status: DC | PRN
  Administered 2016-03-05: 1

## 2016-03-05 MED ORDER — ONDANSETRON HCL 4 MG/2ML IJ SOLN
4.0000 mg | Freq: Four times a day (QID) | INTRAMUSCULAR | Status: DC | PRN
  Administered 2016-03-05: 4 mg via INTRAVENOUS
  Filled 2016-03-05: qty 2

## 2016-03-05 MED ORDER — LACTATED RINGERS IV SOLN
INTRAVENOUS | Status: DC
  Administered 2016-03-05 (×3): via INTRAVENOUS

## 2016-03-05 MED ORDER — MORPHINE SULFATE (PF) 2 MG/ML IV SOLN
2.0000 mg | INTRAVENOUS | Status: DC | PRN
  Administered 2016-03-05: 2 mg via INTRAVENOUS
  Filled 2016-03-05: qty 1

## 2016-03-05 MED ORDER — LIDOCAINE 2% (20 MG/ML) 5 ML SYRINGE
INTRAMUSCULAR | Status: AC
  Filled 2016-03-05: qty 20

## 2016-03-05 MED ORDER — SODIUM CHLORIDE 0.9 % IV SOLN
INTRAVENOUS | Status: DC | PRN
  Administered 2016-03-05: 47 mL

## 2016-03-05 MED ORDER — GLYCOPYRROLATE 0.2 MG/ML IJ SOLN
INTRAMUSCULAR | Status: DC | PRN
  Administered 2016-03-05: 0.6 mg via INTRAVENOUS

## 2016-03-05 MED ORDER — MIDAZOLAM HCL 2 MG/2ML IJ SOLN
INTRAMUSCULAR | Status: AC
  Filled 2016-03-05: qty 2

## 2016-03-05 MED ORDER — BUPIVACAINE-EPINEPHRINE (PF) 0.25% -1:200000 IJ SOLN
INTRAMUSCULAR | Status: AC
  Filled 2016-03-05: qty 30

## 2016-03-05 MED ORDER — PROPOFOL 10 MG/ML IV BOLUS
INTRAVENOUS | Status: DC | PRN
  Administered 2016-03-05: 150 mg via INTRAVENOUS

## 2016-03-05 MED ORDER — FENTANYL CITRATE (PF) 100 MCG/2ML IJ SOLN
INTRAMUSCULAR | Status: DC | PRN
  Administered 2016-03-05 (×3): 50 ug via INTRAVENOUS
  Administered 2016-03-05: 100 ug via INTRAVENOUS

## 2016-03-05 MED ORDER — ROCURONIUM BROMIDE 100 MG/10ML IV SOLN
INTRAVENOUS | Status: DC | PRN
  Administered 2016-03-05: 25 mg via INTRAVENOUS
  Administered 2016-03-05: 4 mg via INTRAVENOUS

## 2016-03-05 MED ORDER — OXYCODONE-ACETAMINOPHEN 5-325 MG PO TABS: 1.0000 | ORAL_TABLET | ORAL | Status: DC | PRN

## 2016-03-05 MED ORDER — CEFOTETAN DISODIUM-DEXTROSE 2-2.08 GM-% IV SOLR
2.0000 g | INTRAVENOUS | Status: AC
  Administered 2016-03-05: 2 g via INTRAVENOUS

## 2016-03-05 MED ORDER — NEOSTIGMINE METHYLSULFATE 5 MG/5ML IV SOSY
PREFILLED_SYRINGE | INTRAVENOUS | Status: AC
  Filled 2016-03-05: qty 5

## 2016-03-05 MED ORDER — IOPAMIDOL (ISOVUE-300) INJECTION 61%
INTRAVENOUS | Status: AC
  Filled 2016-03-05: qty 50

## 2016-03-05 MED ORDER — HYDROCODONE-ACETAMINOPHEN 7.5-325 MG PO TABS: 1.0000 | ORAL_TABLET | Freq: Once | ORAL | Status: DC | PRN

## 2016-03-05 MED ORDER — 0.9 % SODIUM CHLORIDE (POUR BTL) OPTIME
TOPICAL | Status: DC | PRN
  Administered 2016-03-05: 1000 mL

## 2016-03-05 MED ORDER — KETOROLAC TROMETHAMINE 30 MG/ML IJ SOLN
INTRAMUSCULAR | Status: DC | PRN
  Administered 2016-03-05: 30 mg via INTRAVENOUS

## 2016-03-05 MED ORDER — CEFOTETAN DISODIUM-DEXTROSE 2-2.08 GM-% IV SOLR
INTRAVENOUS | Status: AC
  Filled 2016-03-05: qty 50

## 2016-03-05 MED ORDER — SUCCINYLCHOLINE CHLORIDE 20 MG/ML IJ SOLN
INTRAMUSCULAR | Status: DC | PRN
  Administered 2016-03-05: 100 mg via INTRAVENOUS

## 2016-03-05 SURGICAL SUPPLY — 41 items
APPLIER CLIP ROT 10 11.4 M/L (STAPLE)
BLADE SURG ROTATE 9660 (MISCELLANEOUS) IMPLANT
CANISTER SUCTION 2500CC (MISCELLANEOUS) ×2 IMPLANT
CATH CHOLANG 76X19 KUMAR (CATHETERS) ×2 IMPLANT
CHLORAPREP W/TINT 26ML (MISCELLANEOUS) ×2 IMPLANT
CLIP APPLIE ROT 10 11.4 M/L (STAPLE) IMPLANT
CLIP LIGATING HEMO LOK XL GOLD (MISCELLANEOUS) ×2 IMPLANT
CLIP LIGATING HEMO O LOK GREEN (MISCELLANEOUS) IMPLANT
COVER MAYO STAND STRL (DRAPES) ×2 IMPLANT
COVER SURGICAL LIGHT HANDLE (MISCELLANEOUS) ×2 IMPLANT
DRAPE C-ARM 42X72 X-RAY (DRAPES) ×2 IMPLANT
ELECT REM PT RETURN 9FT ADLT (ELECTROSURGICAL) ×2
ELECTRODE REM PT RTRN 9FT ADLT (ELECTROSURGICAL) ×1 IMPLANT
GLOVE BIOGEL PI IND STRL 7.0 (GLOVE) ×2 IMPLANT
GLOVE BIOGEL PI IND STRL 8.5 (GLOVE) ×1 IMPLANT
GLOVE BIOGEL PI INDICATOR 7.0 (GLOVE) ×2
GLOVE BIOGEL PI INDICATOR 8.5 (GLOVE) ×1
GLOVE SURG SS PI 7.0 STRL IVOR (GLOVE) ×4 IMPLANT
GLOVE SURG SS PI 8.0 STRL IVOR (GLOVE) ×2 IMPLANT
GOWN STRL REUS W/ TWL LRG LVL3 (GOWN DISPOSABLE) ×3 IMPLANT
GOWN STRL REUS W/TWL LRG LVL3 (GOWN DISPOSABLE) ×3
GRASPER SUT TROCAR 14GX15 (MISCELLANEOUS) ×2 IMPLANT
KIT BASIN OR (CUSTOM PROCEDURE TRAY) ×2 IMPLANT
KIT ROOM TURNOVER OR (KITS) ×2 IMPLANT
LIQUID BAND (GAUZE/BANDAGES/DRESSINGS) ×2 IMPLANT
NS IRRIG 1000ML POUR BTL (IV SOLUTION) ×2 IMPLANT
PAD ARMBOARD 7.5X6 YLW CONV (MISCELLANEOUS) ×2 IMPLANT
POUCH RETRIEVAL ECOSAC 10 (ENDOMECHANICALS) ×1 IMPLANT
POUCH RETRIEVAL ECOSAC 10MM (ENDOMECHANICALS) ×1
SCISSORS LAP 5X35 DISP (ENDOMECHANICALS) ×2 IMPLANT
SET IRRIG TUBING LAPAROSCOPIC (IRRIGATION / IRRIGATOR) ×2 IMPLANT
SLEEVE ENDOPATH XCEL 5M (ENDOMECHANICALS) ×4 IMPLANT
SPECIMEN JAR SMALL (MISCELLANEOUS) ×2 IMPLANT
STOPCOCK 4 WAY LG BORE MALE ST (IV SETS) ×2 IMPLANT
SUT MNCRL AB 4-0 PS2 18 (SUTURE) ×2 IMPLANT
TOWEL OR 17X24 6PK STRL BLUE (TOWEL DISPOSABLE) ×2 IMPLANT
TOWEL OR 17X26 10 PK STRL BLUE (TOWEL DISPOSABLE) ×2 IMPLANT
TRAY LAPAROSCOPIC MC (CUSTOM PROCEDURE TRAY) ×2 IMPLANT
TROCAR XCEL 12X100 BLDLESS (ENDOMECHANICALS) ×2 IMPLANT
TROCAR XCEL NON-BLD 5MMX100MML (ENDOMECHANICALS) ×2 IMPLANT
TUBING INSUFFLATION (TUBING) ×2 IMPLANT

## 2016-03-05 NOTE — Consult Note (Signed)
                                                                           [facility] Consult: 2:28 PM 03/05/2016     Referring Provider: Dr Kinsinger  Primary Care Physician:  Megan Johnson, DO Primary Gastroenterologist:  Dr. Ron Junco     Reason for Consultation:  Choledocholithias on IOC today   HPI: Dawn Morgan is a 29 y.o. female.  Hs SS trait, GERD.  Underwent lap chole today for 4 to 6 weeks of intermittent RUQ/epigastric pain unresponsive to PPI, elevated LFTs (tibil 2.3, alk phos 226, AST 247, ALT 515).   Ultrasound 5/18: GB stone, sludge, acute cholecystitis.  CBD 7.1 mm, mild intrahepatic duct dilatation.  Did not undergo MRCP  Today at lap chole, IOC: GB inflamed, multiple gallstones, small non-obstructing stones in CBD. LFTs today: T bili 1.3, otherwise normal LFTs.         Past Medical History  Diagnosis Date  . Sickle cell trait (HCC)   . GERD (gastroesophageal reflux disease)   . Choledocholithiasis   . Elevated LFTs   . Right upper quadrant abdominal pain     Past Surgical History  Procedure Laterality Date  . No past surgeries      Prior to Admission medications   Medication Sig Start Date End Date Taking? Authorizing Provider  acetaminophen (TYLENOL) 325 MG tablet Take 650 mg by mouth every 6 (six) hours as needed.   Yes Historical Provider, MD  levonorgestrel-ethinyl estradiol (AVIANE,ALESSE,LESSINA) 0.1-20 MG-MCG tablet Take 1 tablet by mouth daily. Patient taking differently: Take 1 tablet by mouth every evening.  11/28/15  Yes Megan P Johnson, DO  Naproxen Sodium (ALEVE) 220 MG CAPS Take 440 mg by mouth every 8 (eight) hours as needed (For pain.).    Yes Historical Provider, MD  omeprazole (PRILOSEC) 40 MG capsule Take 1 capsule (40 mg total) by mouth daily. Patient taking differently: Take 40 mg by mouth every morning.  01/12/16  Yes Megan P  Johnson, DO  oxyCODONE-acetaminophen (ROXICET) 5-325 MG tablet Take 1 tablet by mouth every 4 (four) hours as needed for severe pain. 03/05/16   Luke Aaron Kinsinger, MD    Scheduled Meds: . cefoTEtan in Dextrose 5%      . HYDROmorphone       Infusions: . lactated ringers 50 mL/hr at 03/05/16 1028   PRN Meds: HYDROcodone-acetaminophen, HYDROmorphone (DILAUDID) injection, ketorolac   Allergies as of 02/26/2016  . (No Known Allergies)    Family History  Problem Relation Age of Onset  . Diabetes Father   . Arthritis Maternal Grandmother   . Colon cancer Neg Hx     Social History   Social History  . Marital Status: Married    Spouse Name: Wellman  . Number of Children: 3  . Years of Education: N/A   Occupational History  . Pastor    Social History Main Topics  . Smoking status: Never Smoker   . Smokeless tobacco: Never Used  . Alcohol Use: Yes     Comment: OCCASIONAL  . Drug Use: No  . Sexual Activity: Yes    Birth Control/ Protection: Pill   Other Topics Concern  . Not   on file   Social History Narrative    REVIEW OF SYSTEMS: Constitutional:  No weakness or falls ENT:  No nose bleeds Pulm:  No cough or sob CV:  No palpitations, no LE edema.  GU:  No hematuria, no frequency.  No tea colored urine GI:  No dysphagia Heme:  No excessive bleeding   Transfusions:  No transfusions in Epic records Neuro:  No headaches, no peripheral tingling or numbness Derm:  No itching, no rash or sores.  Endocrine:  No sweats or chills.  No polyuria or dysuria Immunization:  Not queried Travel:  None beyond local counties in last few months.    PHYSICAL EXAM: Vital signs in last 24 hours: Filed Vitals:   03/05/16 1330 03/05/16 1345  BP: 117/84 115/83  Pulse: 68 61  Temp:    Resp: 16 19   Wt Readings from Last 3 Encounters:  03/05/16 64.411 kg (142 lb)  02/21/16 64.411 kg (142 lb)  02/20/16 64.955 kg (143 lb 3.2 oz)    General: sleepy/drowsy post op in  PACU Head:  No swelling, no asymmetry  Eyes:  No icterus or pallor Ears:  Not HOH  Nose:  No discharge Mouth:  Moist, clear, good teeth Neck:  No mass, TMG or JVD Lungs:  Clear bil.  No cough or dyspnea. Heart: RRR, slight tachy in low 100s Abdomen:  BS not present, soft, not distended.  Glued/intact port scars.  Tender right abdomen.  .   Rectal: deferred   Musc/Skeltl: no joint swelling or deformity Extremities:  No CCE.  Feet are warm.    Neurologic:  Oriented to self and place.  Drowsy post op.   Skin:  No rash, no sores, no jaundice   Psych:  Resting quietly, under sedative effect.   Intake/Output from previous day:   Intake/Output this shift: Total I/O In: 1400 [I.V.:1400] Out: 40 [Blood:40]  LAB RESULTS:  Recent Labs  03/05/16 1011  WBC 3.8*  HGB 11.3*  HCT 35.0*  PLT 402*   BMET Lab Results  Component Value Date   NA 140 03/05/2016   NA 140 02/20/2016   K 3.4* 03/05/2016   K 4.7 02/20/2016   CL 108 03/05/2016   CL 101 02/20/2016   CO2 26 03/05/2016   CO2 24 02/20/2016   GLUCOSE 96 03/05/2016   GLUCOSE 87 02/20/2016   BUN 9 03/05/2016   BUN 11 02/20/2016   CREATININE 0.76 03/05/2016   CREATININE 0.81 02/20/2016   CALCIUM 9.4 03/05/2016   CALCIUM 9.2 02/20/2016   LFT  Recent Labs  03/05/16 1011  PROT 7.5  ALBUMIN 3.9  AST 21  ALT 35  ALKPHOS 89  BILITOT 1.3*   PT/INR Lab Results  Component Value Date   INR 1.0 02/21/2016   Hepatitis Panel No results for input(s): HEPBSAG, HCVAB, HEPAIGM, HEPBIGM in the last 72 hours. C-Diff No components found for: CDIFF Lipase     Component Value Date/Time   LIPASE 85* 02/20/2016 0931    Drugs of Abuse  No results found for: LABOPIA, COCAINSCRNUR, LABBENZ, AMPHETMU, THCU, LABBARB   RADIOLOGY STUDIES: Dg Cholangiogram Operative  03/05/2016  CLINICAL DATA:  Cholelithiasis. EXAM: INTRAOPERATIVE CHOLANGIOGRAM TECHNIQUE: Cholangiographic images from the C-arm fluoroscopic device were submitted  for interpretation post-operatively. Please see the procedural report for the amount of contrast and the fluoroscopy time utilized. COMPARISON:  Ultrasound 02/22/2016 FINDINGS: There is a persistent round filling defect in the distal common bile duct. This is compatible with a distal common   bile duct stone. There is contrast getting into the duodenum. There is extravasation of contrast near the cystic duct cannulation site. No significant contrast refluxing into the common hepatic duct or intrahepatic ducts. IMPRESSION: Nonobstructive distal common bile duct stone. Contrast extravasation probably related to the cannulation site. Electronically Signed   By: Adam  Henn M.D.   On: 03/05/2016 12:47    ENDOSCOPIC STUDIES: none  IMPRESSION:   *  CBD stone on IOC.      PLAN:     *  Set up ERCP for 12:30 tomorrow with Dr Kailyn Vanderslice. Case d/w pt but will need to provide detailed informed consent to her in AM, when sedation wears off.  Headed out to speak with her spouse.  Dr Kinsinger will admit her (had planned discharge with outpt GI follow up)   Sarah Gribbin  03/05/2016, 2:28 PM Pager: [phone]      Attending physician's note   I have taken a history, examined the patient and reviewed the chart. I agree with the Advanced Practitioner's note, impression and recommendations. Pt is S/P lap chole today for symptomatic cholelithiasis with a filling defect identified in the distal CBD on IOC. She recently had elevated LFTs and mildly dilated CBD (7.1 mm) on US.  All findings typical for choledocholithiasis. I personally reviewed the IOC and abd US images. Recommend ERCP with sphincterotomy and stone extraction tomorrow at 1230 which could be performed as outpatient or inpatient. Risks, benefits, alternative to ERCP discussed with patient and she consents to proceed however she still is mildly sedated so will need to review risks and benefits again tomorrow morning with her. SG discussed ERCP and its risk and  benefits with her husband. Current plan is to convert to observation status.   Maymie Brunke, MD FACG [phone] Mon-Fri 8a-5p [phone] after 5p, weekends, holidays 

## 2016-03-05 NOTE — Progress Notes (Signed)
Pt reports feeling nauseous and vomitted once since admission to unit. MD paged as pt has no prn anti emetics ordered

## 2016-03-05 NOTE — H&P (View-Only) (Signed)
History of Present Illness Dawn Morgan(Dawn Morgan A. Artrice Kraker MD; 02/23/2016 11:38 AM) Patient words: gallbladder.  The patient is a 30 year old female who presents for evaluation of gall stones. Referred by Dawn SouJessica Morgan. Patient has a one-year history of intermittent epigastric abdominal pain associated with reflux. She's been on Prilosec for this for a long time with moderate control. In the past month she has had worsening abdominal pain in the specifically right upper quadrant. She does not associate this with any new foods but has tried to maintain a low acid and low spice diet. Last week she had very severe right upper quadrant abdominal pain and was referred to GI who underwent ultrasound showing large gallstones and a slightly dilated common bile duct. She initially had a weighted total bilirubin but that has not decreased. She also had nausea and vomiting multiple times as well as loss of appetite. In the last 5 days she has had much less abdominal pain and is not had much appetite but maintaining a bland diet.  She has had no other abdominal surgeries. She's had 3 pregnancies. She does not smoke.   Other Problems Dawn Morgan(Dawn Dockery, LPN; 4/09/81195/19/2017 14:7811:03 AM) Gastroesophageal Reflux Disease  Past Surgical History Dawn Morgan(Dawn Dockery, LPN; 2/95/62135/19/2017 08:6511:03 AM) No pertinent past surgical history  Diagnostic Studies History Dawn Morgan(Dawn Dockery, LPN; 7/84/69625/19/2017 95:2811:03 AM) Mammogram never Pap Smear 1-5 years ago  Allergies Dawn Morgan(Dawn Dockery, LPN; 4/13/24405/19/2017 10:2711:04 AM) No Known Drug Allergies05/19/2017  Medication History Dawn Morgan(Dawn Dockery, LPN; 2/53/66445/19/2017 03:4711:04 AM) Omeprazole (40MG  Capsule DR, Oral) Active. Dawn Morgan (0.1-20MG -MCG Tablet, Oral) Active. Medications Reconciled  Pregnancy / Birth History Dawn Morgan(Dawn Dockery, LPN; 4/25/95635/19/2017 87:5611:03 AM) Age at menarche 10 years. Contraceptive History Oral contraceptives. Maternal age 32-20    Review of Systems Dawn Morgan(Dawn Morgan A. Dawn Spiers MD; 02/23/2016 11:39 AM) General  Present- Appetite Loss. Not Present- Chills, Fatigue, Fever, Night Sweats, Weight Gain and Weight Loss. Skin Present- Jaundice. Not Present- Change in Wart/Mole, Dryness, Hives, New Lesions, Non-Healing Wounds, Rash and Ulcer. Neck Not Present- Neck Mass, Neck Pain and Neck Stiffness. Respiratory Not Present- Cough, Decreased Exercise Tolerance, Difficulty Breathing and Difficulty Breathing on Exertion. Cardiovascular Present- Chest Pain. Not Present- Difficulty Breathing Lying Down, Leg Cramps, Palpitations, Rapid Heart Rate, Shortness of Breath and Swelling of Extremities. Gastrointestinal Present- Abdominal Pain, Bloating and Indigestion. Not Present- Bloody Stool, Change in Bowel Habits, Chronic diarrhea, Constipation, Difficulty Swallowing, Excessive gas, Gets full quickly at meals, Hemorrhoids, Nausea, Rectal Pain and Vomiting. Musculoskeletal Not Present- Back Pain, Backache, Calf Pain and Claudication. Neurological Not Present- Decreased Memory, Difficulty Speaking, Dizziness, Dysarthria and Dysesthesia. Psychiatric Present- Change in Sleep Pattern. Not Present- Anxiety, Bipolar and Delirium. Hematology Not Present- Abnormal Bleeding, Anemia, Blood Clots and Blood Thinners.  Vitals Dawn Morgan(Dawn Dockery LPN; 4/33/29515/19/2017 88:4111:04 AM) 02/23/2016 11:04 AM Weight: 144.4 lb Height: 66in Body Surface Area: 1.74 m Body Mass Index: 23.31 kg/m  Temp.: 98.64F(Oral)  Pulse: 72 (Regular)  BP: 116/68 (Sitting, Left Arm, Standard)       Physical Exam Dawn Morgan(Dawn Morgan A Dawn Kight, MD; 02/23/2016 11:40 AM) General Mental Status-Alert. General Appearance-Cooperative. Orientation-Oriented X4. Posture-Normal posture.  Integumentary Global Assessment Normal Exam - Head/Face: no rashes, ulcers, lesions or evidence of photo damage. No palpable nodules or masses and Neck: no visible lesions or palpable masses.  Head and Neck Head-normocephalic, atraumatic with no lesions or palpable  masses. Face Global Assessment - atraumatic. Thyroid Gland Characteristics - normal size and consistency.  Eye Eyeball - Bilateral-Extraocular movements intact. Sclera/Conjunctiva - Bilateral-No scleral icterus, No Discharge.  ENMT  Nose and Sinuses Nose - no deformities observed, no swelling present.  Chest and Lung Exam Palpation Normal exam - Non-tender. Auscultation Breath sounds - Normal.  Cardiovascular Auscultation Rhythm - Regular. Heart Sounds - S1 WNL and S2 WNL. Carotid arteries - No Carotid bruit.  Abdomen Inspection Normal Exam - No Visible peristalsis, No Abnormal pulsations and No Paradoxical movements. Palpation/Percussion Normal exam - Soft, No Rebound tenderness, No Rigidity (guarding), No hepatosplenomegaly and No Palpable abdominal masses. Tenderness - Right Upper Quadrant. Gallbladder - Negative Murphy's sign.  Peripheral Vascular Upper Extremity Palpation - Pulses bilaterally normal. Lower Extremity Palpation - Edema - Bilateral - No edema.  Neurologic Neurologic evaluation reveals -normal sensation and normal coordination.  Neuropsychiatric Mental status exam performed with findings of-able to articulate well with normal speech/language, rate, volume and coherence and thought content normal with ability to perform basic computations and apply abstract reasoning.  Musculoskeletal Normal Exam - Bilateral-Upper Extremity Strength Normal and Lower Extremity Strength Normal.    Assessment & Plan Dawn Morgan(Dawn Morgan A. Dawn Chrystal MD; 02/23/2016 11:40 AM) GALL BLADDER STONES (K80.20) CHOLEDOCHOLITHIASIS (K80.50) Impression: 30 year old female with two-week history of present quadrant abdominal pain and findings of 2.5 cm gallstones as well as common bile duct dilation to 7 mm and bilirubin up to 2.2. This is consistent with recent history of choledocholithiasis based off of decreased bilirubin decreased pain likely has passed. Due to consultation of  gallstone-related disease I recommended urgent cholecystectomy. We discussed the etiology of gallstones and probably cause pain. We discussed exacerbating factors including fatty meals. We discussed the details of surgery for removal of the gallbladder including general anesthesia, 4 small incisions in the patient's abdomen, removal of the patient's gallbladder with the liver and common bile duct, and most likely outpatient procedure. We discussed risks of common bile duct injury, cystic duct stump leak, injury to liver, bleeding, infection, need for open procedure, and this cholecystectomy syndrome. The patient showed good understanding and wanted to proceed with laparoscopic cholecystectomy. Current Plans You are being scheduled for surgery - Our schedulers will call you.  You should hear from our office's scheduling department within 5 working days about the location, date, and time of surgery. We try to make accommodations for patient's preferences in scheduling surgery, but sometimes the OR schedule or the surgeon's schedule prevents us from making those accommodations.  If you have not heard from our office [phone]) in 5 working days, call the office and ask for your surgeon's nurse.  If you have other questions about your diagnosis, plan, or surgery, call the office and ask for your surgeon's nurse.  Pt Education - Pamphlet Given - Laparoscopic Gallbladder Surgery: discussed with patient and provided information. Pt Education - Laparoscopic Cholecystectomy: gallbladder The anatomy & physiology of hepatobiliary & pancreatic function was discussed. The pathophysiology of gallbladder dysfunction was discussed. Natural history risks without surgery was discussed. I feel the risks of no intervention will lead to serious problems that outweigh the operative risks; therefore, I recommended cholecystectomy to remove the pathology. I explained laparoscopic techniques with possible need for an open  approach. Probable cholangiogram to evaluate the bilary tract was explained as well.  Risks such as bleeding, infection, abscess, leak, injury to other organs, need for further treatment, heart attack, death, and other risks were discussed. I noted a good likelihood this will help address the problem. Possibility that this will not correct all abdominal symptoms was explained. Goals of post-operative recovery were discussed as well. We will work to minimize complications. An  educational handout further explaining the pathology and treatment options was given as well. Questions were answered. The patient expresses understanding & wishes to proceed with surgery.

## 2016-03-05 NOTE — Progress Notes (Deleted)
Paged on-call Trauma MD due to patient's CBG=[id number] but asymptomatic. Dr. Grayling CongressA. Ramirez responded and ordered STAT lab and give maximum SSI Novolog of 20 Units per protocol.  Inform family members to feed patient with low carbohydrates and low sugar drinks such as diet colas which [facility] can provide.  Provided patient also with I.S. to work on since congestion is noted and encourage to take a deep breath and cough.  Daughter provided translation to patient.

## 2016-03-05 NOTE — Op Note (Signed)
PATIENT:  Dawn Morgan  30 y.o. female  PRE-OPERATIVE DIAGNOSIS:  Cholethiiasis   POST-OPERATIVE DIAGNOSIS:  Cholethiiasis   PROCEDURE:  Procedure(s): LAPAROSCOPIC CHOLECYSTECTOMY WITH INTRAOPERATIVE CHOLANGIOGRAM   SURGEON:  Surgeon(s): De BlanchLuke Aaron Kinsinger, MD  ASSISTANT: none   ANESTHESIA:   local and general   Indications for procedure: Dawn NovemberLaure Beadles is a 30 y.o. female with symptoms of RUQ pain and Nausea consistent with gallbladder disease, Confirmed by Ultrasound.  Description of procedure: The patient was brought into the operative suite, placed supine. Anesthesia was administered with endotracheal tube. Patient was strapped in place and foot board was secured. All pressure points were offloaded by foam padding. The patient was prepped and draped in the usual sterile fashion.  A small incision was made to the right of the umbilicus. A 5mm trocar was inserted into the peritoneal cavity with optical entry. Pneumoperitoneum was applied with high flow low pressure. 2 5mm trocars were placed in the RUQ. A 12mm trocar was placed in the subxiphoid space. All trocars sites were first anesthesized with 0.25% marcaine with epinephrine in the subcutaneous and preperitoneal layers. Next the patient was placed in reverse trendelenberg. The gallbladder was inflamed with multiple small stones present.  The gallbladder was retracted cephalad and lateral. The peritoneum was reflected off the infundibulum working lateral to medial. The cystic duct and cystic artery were identified and further dissection revealed a critical view, due to concern for choledocholithiasis a cholangiogram was performed with Rolene ArbourKumar Clamp. There were opacities present in the common duct, contrast emptied into the duodenum. The cystic duct and cystic artery were doubly clipped and ligated.   The gallbladder was removed off the liver bed with cautery. The Gallbladder was placed in a specimen bag. The gallbladder fossa was  irrigated and hemostasis was applied with cautery. The gallbladder was removed via the 12mm trocar. The fascial defect was closed with interrupted 0 vicryl suture via laparoscopic trans-fascial suture passer. Pneumoperitoneum was removed, all trocar were removed. All incisions were closed with 4-0 monocryl subcuticular stitch. The patient woke from anesthesia and was brought to PACU in stable condition. All counts were correct  Findings: inflamed gallblader with multiple stones and small nonobstructing stones in the duct.  Specimen: gallbladder  Blood loss: Total I/O In: 1000 [I.V.:1000] Out: 40 [Blood:40] ml  Local anesthesia: 30 ml 0.25% marcaine with epinephrine  Complications: none  PLAN OF CARE: Discharge to home after PACU, follow up with GI to evaluate biliary tree  PATIENT DISPOSITION:  PACU - hemodynamically stable.  Feliciana RossettiLuke Kinsinger, M.D. General, Bariatric, & Minimally Invasive Surgery [facility]

## 2016-03-05 NOTE — Anesthesia Procedure Notes (Signed)
Procedure Name: Intubation Date/Time: 03/05/2016 11:37 AM Performed by: AUSTON, AMANDA M Pre-anesthesia Checklist: Patient identified, Emergency Drugs available, Suction available, Patient being monitored and Timeout performed Patient Re-evaluated:Patient Re-evaluated prior to inductionOxygen Delivery Method: Circle system utilized Preoxygenation: Pre-oxygenation with 100% oxygen Intubation Type: IV induction Ventilation: Mask ventilation without difficulty Laryngoscope Size: Mac and 3 Grade View: Grade I Tube type: Oral Tube size: 7.0 mm Number of attempts: 1 Airway Equipment and Method: LTA kit utilized and Stylet Placement Confirmation: ETT inserted through vocal cords under direct vision Secured at: 22 cm Tube secured with: Tape Dental Injury: Teeth and Oropharynx as per pre-operative assessment      

## 2016-03-05 NOTE — Anesthesia Preprocedure Evaluation (Addendum)
Anesthesia Evaluation  Patient identified by MRN, date of birth, ID band Patient awake    Reviewed: Allergy & Precautions, NPO status , Patient's Chart, lab work & pertinent test results  Airway Mallampati: II  TM Distance: >3 FB     Dental  (+) Dental Advisory Given   Pulmonary neg pulmonary ROS,    breath sounds clear to auscultation       Cardiovascular negative cardio ROS   Rhythm:Regular Rate:Normal     Neuro/Psych negative neurological ROS     GI/Hepatic Neg liver ROS, GERD  ,  Endo/Other  negative endocrine ROS  Renal/GU negative Renal ROS     Musculoskeletal   Abdominal   Peds  Hematology negative hematology ROS (+) Sickle cell trait   Anesthesia Other Findings   Reproductive/Obstetrics                             Lab Results  Component Value Date   WBC 4.2 02/20/2016   HCT 35.2 02/20/2016   MCV 83 02/20/2016   PLT 340 02/20/2016   Lab Results  Component Value Date   CREATININE 0.81 02/20/2016   BUN 11 02/20/2016   NA 140 02/20/2016   K 4.7 02/20/2016   CL 101 02/20/2016   CO2 24 02/20/2016     Anesthesia Physical Anesthesia Plan  ASA: I  Anesthesia Plan: General   Post-op Pain Management:    Induction: Intravenous  Airway Management Planned: Oral ETT  Additional Equipment:   Intra-op Plan:   Post-operative Plan: Extubation in OR  Informed Consent: I have reviewed the patients History and Physical, chart, labs and discussed the procedure including the risks, benefits and alternatives for the proposed anesthesia with the patient or authorized representative who has indicated his/her understanding and acceptance.   Dental advisory given  Plan Discussed with: CRNA  Anesthesia Plan Comments:        Anesthesia Quick Evaluation

## 2016-03-05 NOTE — Telephone Encounter (Signed)
Spoke with Jennye MoccasinSarah Gribbin and she will [facility].

## 2016-03-05 NOTE — Interval H&P Note (Signed)
History and Physical Interval Note:  03/05/2016 11:05 AM  Stephens NovemberLaure Elks  has presented today for surgery, with the diagnosis of Cholethiiasis   The various methods of treatment have been discussed with the patient and family. After consideration of risks, benefits and other options for treatment, the patient has consented to  Procedure(s): LAPAROSCOPIC CHOLECYSTECTOMY WITH INTRAOPERATIVE CHOLANGIOGRAM (N/A) as a surgical intervention .  The patient's history has been reviewed, patient examined, no change in status, stable for surgery.  I have reviewed the patient's chart and labs.  Questions were answered to the patient's satisfaction.     De BlanchLuke Aaron Kinsinger

## 2016-03-05 NOTE — Transfer of Care (Signed)
Immediate Anesthesia Transfer of Care Note  Patient: Dawn Morgan  Procedure(s) Performed: Procedure(s): LAPAROSCOPIC CHOLECYSTECTOMY WITH INTRAOPERATIVE CHOLANGIOGRAM (N/A)  Patient Location: PACU  Anesthesia Type:General  Level of Consciousness: sedated and responds to stimulation  Airway & Oxygen Therapy: Patient Spontanous Breathing and Patient connected to nasal cannula oxygen  Post-op Assessment: Report given to RN and Post -op Vital signs reviewed and stable  Post vital signs: Reviewed and stable  Last Vitals:  Filed Vitals:   03/05/16 1012  BP: 123/84  Pulse: 76  Temp: 37.1 C  Resp: 18    Last Pain: There were no vitals filed for this visit.    Patients Stated Pain Goal: 2 (03/05/16 1022)  Complications: No apparent anesthesia complications

## 2016-03-05 NOTE — Progress Notes (Signed)
 4mg  iv zofran administered as ordered by MD for c/o nausea. Pt not actively vomitted by spitting out saliva

## 2016-03-06 ENCOUNTER — Encounter (HOSPITAL_COMMUNITY): Payer: Self-pay | Admitting: *Deleted

## 2016-03-06 ENCOUNTER — Encounter (HOSPITAL_COMMUNITY)
Admission: RE | Disposition: A | Discharge status: Home / Self Care | Payer: Self-pay | Source: Ambulatory Visit | Attending: General Surgery

## 2016-03-06 ENCOUNTER — Observation Stay (HOSPITAL_COMMUNITY): Payer: BLUE CROSS/BLUE SHIELD

## 2016-03-06 ENCOUNTER — Observation Stay (HOSPITAL_COMMUNITY): Payer: BLUE CROSS/BLUE SHIELD | Admitting: Anesthesiology

## 2016-03-06 DIAGNOSIS — Z79899 Other long term (current) drug therapy: Secondary | ICD-10-CM | POA: Diagnosis not present

## 2016-03-06 DIAGNOSIS — K8050 Calculus of bile duct without cholangitis or cholecystitis without obstruction: Secondary | ICD-10-CM | POA: Diagnosis not present

## 2016-03-06 DIAGNOSIS — K8040 Calculus of bile duct with cholecystitis, unspecified, without obstruction: Secondary | ICD-10-CM

## 2016-03-06 DIAGNOSIS — Z793 Long term (current) use of hormonal contraceptives: Secondary | ICD-10-CM | POA: Diagnosis not present

## 2016-03-06 DIAGNOSIS — K219 Gastro-esophageal reflux disease without esophagitis: Secondary | ICD-10-CM | POA: Diagnosis not present

## 2016-03-06 DIAGNOSIS — D573 Sickle-cell trait: Secondary | ICD-10-CM | POA: Diagnosis not present

## 2016-03-06 DIAGNOSIS — K8042 Calculus of bile duct with acute cholecystitis without obstruction: Secondary | ICD-10-CM | POA: Diagnosis not present

## 2016-03-06 HISTORY — PX: ERCP: SHX5425

## 2016-03-06 SURGERY — ERCP, WITH INTERVENTION IF INDICATED
Anesthesia: General

## 2016-03-06 MED ORDER — ONDANSETRON HCL 4 MG/2ML IJ SOLN
INTRAMUSCULAR | Status: DC | PRN
  Administered 2016-03-06: 4 mg via INTRAVENOUS

## 2016-03-06 MED ORDER — INDOMETHACIN 50 MG RE SUPP
RECTAL | Status: AC
  Filled 2016-03-06: qty 2

## 2016-03-06 MED ORDER — GLUCAGON HCL RDNA (DIAGNOSTIC) 1 MG IJ SOLR
INTRAMUSCULAR | Status: AC
  Filled 2016-03-06: qty 1

## 2016-03-06 MED ORDER — LIDOCAINE HCL (CARDIAC) 20 MG/ML IV SOLN
INTRAVENOUS | Status: DC | PRN
  Administered 2016-03-06: 60 mg via INTRAVENOUS

## 2016-03-06 MED ORDER — SUCCINYLCHOLINE CHLORIDE 20 MG/ML IJ SOLN
INTRAMUSCULAR | Status: DC | PRN
  Administered 2016-03-06: 80 mg via INTRAVENOUS

## 2016-03-06 MED ORDER — IOPAMIDOL (ISOVUE-300) INJECTION 61%
INTRAVENOUS | Status: AC
  Filled 2016-03-06: qty 50

## 2016-03-06 MED ORDER — SODIUM CHLORIDE 0.9 % IV SOLN
INTRAVENOUS | Status: DC | PRN
  Administered 2016-03-06: 30 mL

## 2016-03-06 MED ORDER — SODIUM CHLORIDE 0.9 % IV SOLN: INTRAVENOUS | Status: DC

## 2016-03-06 MED ORDER — INDOMETHACIN 50 MG RE SUPP
100.0000 mg | Freq: Once | RECTAL | Status: AC
  Administered 2016-03-06: 100 mg via RECTAL

## 2016-03-06 MED ORDER — MIDAZOLAM HCL 5 MG/5ML IJ SOLN
INTRAMUSCULAR | Status: DC | PRN
  Administered 2016-03-06: 2 mg via INTRAVENOUS

## 2016-03-06 MED ORDER — PROPOFOL 10 MG/ML IV BOLUS
INTRAVENOUS | Status: DC | PRN
  Administered 2016-03-06: 200 mg via INTRAVENOUS

## 2016-03-06 MED ORDER — GLUCAGON HCL RDNA (DIAGNOSTIC) 1 MG IJ SOLR
INTRAMUSCULAR | Status: DC | PRN
  Administered 2016-03-06: .25 mg via INTRAVENOUS

## 2016-03-06 MED ORDER — FENTANYL CITRATE (PF) 100 MCG/2ML IJ SOLN
INTRAMUSCULAR | Status: DC | PRN
  Administered 2016-03-06: 25 ug via INTRAVENOUS

## 2016-03-06 MED ORDER — CEFAZOLIN SODIUM-DEXTROSE 2-4 GM/100ML-% IV SOLN
2.0000 g | INTRAVENOUS | Status: AC
  Administered 2016-03-06: 2 g via INTRAVENOUS
  Filled 2016-03-06: qty 100

## 2016-03-06 MED ORDER — LACTATED RINGERS IV SOLN
INTRAVENOUS | Status: DC
  Administered 2016-03-06: 1000 mL via INTRAVENOUS
  Administered 2016-03-06 (×2): via INTRAVENOUS

## 2016-03-06 NOTE — Op Note (Signed)
[facility] Patient Name: Dawn Morgan Procedure Date : 03/06/2016 MRN: [id number] Attending MD: Meryl Dare , MD Date of Birth: 31-Mar-1986 CSN: [id number] Age: 30 Admit Type: Inpatient Procedure:                ERCP Indications:              Bile duct stone(s) Providers:                Venita Lick. Russella Dar, MD, Dwain Sarna, RN, Jacquiline Doe, RN, Darletta Moll, Technician, Ginette Otto, CRNA Referring MD:             Feliciana Rossetti, MD Medicines:                General Anesthesia Complications:            No immediate complications. Estimated Blood Loss:     Estimated blood loss: none. Estimated blood loss:                            none. Procedure:                Pre-Anesthesia Assessment:                           - Prior to the procedure, a History and Physical                            was performed, and patient medications and                            allergies were reviewed. The patient's tolerance of                            previous anesthesia was also reviewed. The risks                            and benefits of the procedure and the sedation                            options and risks were discussed with the patient.                            All questions were answered, and informed consent                            was obtained. Prior Anticoagulants: The patient has                            taken no previous anticoagulant or antiplatelet                            agents. ASA Grade Assessment: II - A patient with  mild systemic disease. After reviewing the risks                            and benefits, the patient was deemed in                            satisfactory condition to undergo the procedure.                           After obtaining informed consent, the scope was                            passed under direct vision. Throughout the                            procedure, the patient's  blood pressure, pulse, and                            oxygen saturations were monitored continuously. The                            [id number] ([id number]) scope was introduced through                            the mouth, and used to inject contrast into and                            used to cannulate the bile duct. The ERCP was                            accomplished without difficulty. The patient                            tolerated the procedure well. Scope In: Scope Out: Findings:      The scout film was normal. The esophagus was successfully intubated       under direct vision. The scope was advanced to a normal major papilla in       the descending duodenum without detailed examination of the pharynx,       larynx and associated structures, and upper GI tract. The upper GI tract       was grossly normal. The bile duct was deeply cannulated with the       short-nosed traction sphincterotome following guidewire cannulation of       the CBD. Contrast was injected. I personally interpreted the bile duct       images. There was appropriate flow of contrast through the ducts. The       lower third of the main duct contained one stone, which was 6 mm in       diameter. The common bile duct was mildly dilated, with a stone causing       an obstruction. The largest diameter was 8 mm. A cholecystectomy had       been performed. The intrahepatic ducts appeared normal. A 6-7 mm biliary       sphincterotomy was made with a traction (standard) sphincterotome. There  was no post-sphincterotomy bleeding. The biliary tree was swept several       times with a balloon starting at the bifurcation. One stone was removed.       No stones remained. The PD was not cannulated or injected by intention. Impression:               - The common bile duct was mildly dilated, with a                            stone causing an obstruction.                           - The patient has had a cholecystectomy.                            - Choledocholithiasis was found in the distal CBD.                            Complete removal was accomplished by biliary                            sphincterotomy and balloon extraction.                           - A biliary sphincterotomy was performed. Moderate Sedation:      not done      not performed Recommendation:           - Avoid aspirin and nonsteroidal anti-inflammatory                            medicines for 1 week.                           - Clear liquid diet today, then advance as                            tolerated to resume previous diet. Procedure Code(s):        --- Professional ---                           [phone], Endoscopic retrograde                            cholangiopancreatography (ERCP); with removal of                            calculi/debris from biliary/pancreatic duct(s)                           43262, Endoscopic retrograde                            cholangiopancreatography (ERCP); with                            sphincterotomy/papillotomy  40981, Endoscopic catheterization of the biliary                            ductal system, radiological supervision and                            interpretation Diagnosis Code(s):        --- Professional ---                           K80.51, Calculus of bile duct without cholangitis                            or cholecystitis with obstruction                           Z90.49, Acquired absence of other specified parts                            of digestive tract CPT copyright 2016 [employer]. All rights reserved. The codes documented in this report are preliminary and upon coder review may  be revised to meet current compliance requirements. Meryl Dare, MD 03/06/2016 2:01:42 PM This report has been signed electronically. Number of Addenda: 0

## 2016-03-06 NOTE — Anesthesia Postprocedure Evaluation (Signed)
Anesthesia Post Note  Patient: Dawn Morgan  Procedure(s) Performed: Procedure(s) (LRB): LAPAROSCOPIC CHOLECYSTECTOMY WITH INTRAOPERATIVE CHOLANGIOGRAM (N/A)  Patient location during evaluation: PACU Anesthesia Type: General Level of consciousness: awake and alert and patient cooperative Pain management: pain level controlled Vital Signs Assessment: post-procedure vital signs reviewed and stable Respiratory status: spontaneous breathing and respiratory function stable Cardiovascular status: stable Anesthetic complications: no    Last Vitals:  Filed Vitals:   03/05/16 2020 03/06/16 0519  BP: 132/90 107/73  Pulse: 68 61  Temp: 36.7 C 37.3 C  Resp: 18 19    Last Pain:  Filed Vitals:   03/06/16 0521  PainSc: 2                  Brienne Liguori S

## 2016-03-06 NOTE — Progress Notes (Signed)
Daily Rounding Note  03/06/2016, 8:57 AM    SUBJECTIVE:       N/V with attempts to consume clear liquids last night.  Now she is NPO and not having the sxs.  Post surgical pain in RUQ.  No dyspnea but breathing deep sets off the RUQ pain.    OBJECTIVE:         Vital signs in last 24 hours:    Temp:  [97.4 F (36.3 C)-99.1 F (37.3 C)] 99.1 F (37.3 C) (05/31 0519) Pulse Rate:  [57-76] 61 (05/31 0519) Resp:  [11-19] 19 (05/31 0519) BP: (107-132)/(73-99) 107/73 mmHg (05/31 0519) SpO2:  [100 %] 100 % (05/31 0519) Weight:  [64.411 kg (142 lb)] 64.411 kg (142 lb) (05/30 1012)   Filed Weights   02/26/16 1447 03/05/16 1012  Weight: 64.411 kg (142 lb) 64.411 kg (142 lb)   General: pleasant, looks well and comfortable   Heart: RRR Chest: clear bil.  No cough or dyspnea Abdomen: soft, ND, active BS.  Tender in upper mid/right abdomen  Extremities: no CCE Neuro/Psych:  Oriented x 3.  No limb weakness.  No tremor.  Fully alert.   Intake/Output from previous day: 05/30 0701 - 05/31 0700 In: 2240 [P.O.:240; I.V.:2000] Out: 640 [Urine:600; Blood:40]  Intake/Output this shift:    Lab Results:  Recent Labs  03/05/16 1011  WBC 3.8*  HGB 11.3*  HCT 35.0*  PLT 402*   BMET  Recent Labs  03/05/16 1011  NA 140  K 3.4*  CL 108  CO2 26  GLUCOSE 96  BUN 9  CREATININE 0.76  CALCIUM 9.4   LFT  Recent Labs  03/05/16 1011  PROT 7.5  ALBUMIN 3.9  AST 21  ALT 35  ALKPHOS 89  BILITOT 1.3*   PT/INR No results for input(s): LABPROT, INR in the last 72 hours. Hepatitis Panel No results for input(s): HEPBSAG, HCVAB, HEPAIGM, HEPBIGM in the last 72 hours.  Studies/Results: Dg Cholangiogram Operative  03/05/2016  CLINICAL DATA:  Cholelithiasis. EXAM: INTRAOPERATIVE CHOLANGIOGRAM TECHNIQUE: Cholangiographic images from the C-arm fluoroscopic device were submitted for interpretation post-operatively. Please see the  procedural report for the amount of contrast and the fluoroscopy time utilized. COMPARISON:  Ultrasound 02/22/2016 FINDINGS: There is a persistent round filling defect in the distal common bile duct. This is compatible with a distal common bile duct stone. There is contrast getting into the duodenum. There is extravasation of contrast near the cystic duct cannulation site. No significant contrast refluxing into the common hepatic duct or intrahepatic ducts. IMPRESSION: Nonobstructive distal common bile duct stone. Contrast extravasation probably related to the cannulation site. Electronically Signed   By: Richarda OverlieAdam  Henn M.D.   On: 03/05/2016 12:47    ASSESMENT:   *  Choledocholithiasis on IOC.  *  Cholelithiasis and cholecystitis.  S/p 5/30 lap chole.    PLAN   *  ERCP set for 12:30 today.  preop abx and post op indocin ordered.  Discussed details of risks, alternatives and logistics with pt, she is agreeable to proceed.     Dawn Morgan  03/06/2016, 8:57 AM Pager: [phone]    Attending physician's note   I have taken an interval history, reviewed the chart and examined the patient. I agree with the Advanced Practitioner's note, impression and recommendations. Choledocholithiasis on IOC. S/P lap chole yesterday. The risks (including pancreatitis, bleeding, perforation, infection, missed lesions, medication reactions and possible hospitalization or surgery if complications occur), benefits,  and alternatives to ERCP were discussed with the patient and her husband and she consents to proceed.    Claudette Head, MD Clementeen Graham [phone] Mon-Fri 8a-5p [phone] after 5p, weekends, holidays

## 2016-03-06 NOTE — Interval H&P Note (Signed)
History and Physical Interval Note:  03/06/2016 12:51 PM  Stephens NovemberLaure Bukhari  has presented today for surgery, with the diagnosis of sholedocholithiasis  The various methods of treatment have been discussed with the patient and family. After consideration of risks, benefits and other options for treatment, the patient has consented to  Procedure(s): ENDOSCOPIC RETROGRADE CHOLANGIOPANCREATOGRAPHY (ERCP) (N/A) as a surgical intervention .  The patient's history has been reviewed, patient examined, no change in status, stable for surgery.  I have reviewed the patient's chart and labs.  Questions were answered to the patient's satisfaction.     Venita LickMalcolm T. Russella DarStark

## 2016-03-06 NOTE — Progress Notes (Signed)
Discharge instructions reviewed with pt and prescription given.  Pt verbalized understanding and had no questions.  Pt discharged in stable condition via wheelchair with husband.  Dawn Morgan   

## 2016-03-06 NOTE — Transfer of Care (Signed)
Immediate Anesthesia Transfer of Care Note  Patient: Stephens NovemberLaure Alvillar  Procedure(s) Performed: Procedure(s): ENDOSCOPIC RETROGRADE CHOLANGIOPANCREATOGRAPHY (ERCP) (N/A)  Patient Location: Endoscopy Unit  Anesthesia Type:General  Level of Consciousness: awake, alert  and oriented  Airway & Oxygen Therapy: Patient Spontanous Breathing and Patient connected to nasal cannula oxygen  Post-op Assessment: Report given to RN, Post -op Vital signs reviewed and stable and Patient moving all extremities X 4  Post vital signs: Reviewed and stable  Last Vitals:  Filed Vitals:   03/06/16 1440 03/06/16 1442  BP: 127/96 127/96  Pulse: 60 59  Temp:    Resp: 14 14    Last Pain:  Filed Vitals:   03/06/16 1442  PainSc: 5       Patients Stated Pain Goal: 0 (03/06/16 1153)  Complications: No apparent anesthesia complications

## 2016-03-06 NOTE — Anesthesia Postprocedure Evaluation (Signed)
Anesthesia Post Note  Patient: Stephens NovemberLaure Judy  Procedure(s) Performed: Procedure(s) (LRB): ENDOSCOPIC RETROGRADE CHOLANGIOPANCREATOGRAPHY (ERCP) (N/A)  Patient location during evaluation: PACU Anesthesia Type: General Level of consciousness: sedated and patient cooperative Pain management: pain level controlled Vital Signs Assessment: post-procedure vital signs reviewed and stable Respiratory status: spontaneous breathing Cardiovascular status: stable Anesthetic complications: no    Last Vitals:  Filed Vitals:   03/06/16 1442 03/06/16 1500  BP: 127/96 123/82  Pulse: 59 62  Temp:  36.6 C  Resp: 14 16    Last Pain:  Filed Vitals:   03/06/16 1502  PainSc: 5                  Lewie LoronJohn Sonu Kruckenberg

## 2016-03-06 NOTE — Anesthesia Preprocedure Evaluation (Addendum)
Anesthesia Evaluation  Patient identified by MRN, date of birth, ID band Patient awake    Reviewed: Allergy & Precautions, NPO status , Patient's Chart, lab work & pertinent test results  Airway Mallampati: II  TM Distance: >3 FB     Dental  (+) Dental Advisory Given, Edentulous Upper   Pulmonary neg pulmonary ROS,    breath sounds clear to auscultation       Cardiovascular negative cardio ROS   Rhythm:Regular Rate:Normal     Neuro/Psych negative neurological ROS     GI/Hepatic Neg liver ROS, GERD  Medicated and Controlled,  Endo/Other  negative endocrine ROS  Renal/GU negative Renal ROS     Musculoskeletal   Abdominal   Peds  Hematology negative hematology ROS (+) Sickle cell trait   Anesthesia Other Findings   Reproductive/Obstetrics                           Lab Results  Component Value Date   WBC 3.8* 03/05/2016   HGB 11.3* 03/05/2016   HCT 35.0* 03/05/2016   MCV 81.8 03/05/2016   PLT 402* 03/05/2016   Lab Results  Component Value Date   CREATININE 0.76 03/05/2016   BUN 9 03/05/2016   NA 140 03/05/2016   K 3.4* 03/05/2016   CL 108 03/05/2016   CO2 26 03/05/2016     Anesthesia Physical  Anesthesia Plan  ASA: II  Anesthesia Plan: General   Post-op Pain Management:    Induction: Intravenous  Airway Management Planned: Oral ETT  Additional Equipment:   Intra-op Plan:   Post-operative Plan: Extubation in OR  Informed Consent: I have reviewed the patients History and Physical, chart, labs and discussed the procedure including the risks, benefits and alternatives for the proposed anesthesia with the patient or authorized representative who has indicated his/her understanding and acceptance.   Dental advisory given  Plan Discussed with: CRNA  Anesthesia Plan Comments:         Anesthesia Quick Evaluation

## 2016-03-06 NOTE — Anesthesia Procedure Notes (Signed)
Procedure Name: Intubation Date/Time: 03/06/2016 1:18 PM Performed by: Carmela RimaMARTINELLI, [name] Pre-anesthesia Checklist: Patient being monitored, Suction available, Emergency Drugs available, Patient identified and Timeout performed Patient Re-evaluated:Patient Re-evaluated prior to inductionOxygen Delivery Method: Circle system utilized Preoxygenation: Pre-oxygenation with 100% oxygen Intubation Type: IV induction Ventilation: Mask ventilation without difficulty Laryngoscope Size: Mac and 3 (intubation per [name],CRNA) Grade View: Grade I Tube type: Oral Number of attempts: 1 Placement Confirmation: positive ETCO2,  ETT inserted through vocal cords under direct vision and breath sounds checked- equal and bilateral Secured at: 22 cm Tube secured with: Tape Dental Injury: Teeth and Oropharynx as per pre-operative assessment

## 2016-03-06 NOTE — Discharge Summary (Signed)
Physician Discharge Summary  Patient ID: Dawn NovemberLaure Morgan MRN: [id number] DOB/AGE: 05/23/1986 30 y.o.  Admit date: 03/05/2016 Discharge date: 03/06/2016  Admission Diagnoses:  Discharge Diagnoses:  Active Problems:   Choledocholithiasis with acute cholecystitis   Common bile duct stone   Discharged Condition: good  Hospital Course: 30 yo female presented for lap chole for choledocholithiasis. She was found to have a positive IOC and stayed overnight to have ERCP with stone extraction done the next day. She felt better afterwards, tolerated diet and was discharged home.  Consults: GI  Significant Diagnostic Studies: labs: tbili 1.3  Treatments: lap chole w ioc, ERCP with stone extraction  Discharge Exam: Blood pressure 123/82, pulse 62, temperature 97.9 F (36.6 C), temperature source Oral, resp. rate 16, height 5' 6.5" (1.689 m), weight 64.411 kg (142 lb), last menstrual period 02/28/2016, SpO2 100 %. General appearance: alert and cooperative Head: Normocephalic, without obvious abnormality, atraumatic Neck: no adenopathy, no carotid bruit, no JVD, supple, symmetrical, trachea midline and thyroid not enlarged, symmetric, no tenderness/mass/nodules Back: symmetric, no curvature. ROM normal. No CVA tenderness. Resp: clear to auscultation bilaterally Cardio: regular rate and rhythm, S1, S2 normal, no murmur, click, rub or gallop GI: soft, non-tender; bowel sounds normal; no masses,  no organomegaly and wounds c/d/i  Disposition: Final discharge disposition not confirmed  Discharge Instructions    Call MD for:  difficulty breathing, headache or visual disturbances    Complete by:  As directed      Call MD for:  difficulty breathing, headache or visual disturbances    Complete by:  As directed      Call MD for:  persistant nausea and vomiting    Complete by:  As directed      Call MD for:  persistant nausea and vomiting    Complete by:  As directed      Call MD for:  redness,  tenderness, or signs of infection (pain, swelling, redness, odor or green/yellow discharge around incision site)    Complete by:  As directed      Call MD for:  redness, tenderness, or signs of infection (pain, swelling, redness, odor or green/yellow discharge around incision site)    Complete by:  As directed      Call MD for:  severe uncontrolled pain    Complete by:  As directed      Call MD for:  severe uncontrolled pain    Complete by:  As directed      Call MD for:  temperature >100.4    Complete by:  As directed      Call MD for:  temperature >100.4    Complete by:  As directed      Diet - low sodium heart healthy    Complete by:  As directed      Diet - low sodium heart healthy    Complete by:  As directed      Discharge wound care:    Complete by:  As directed   Ok to shower tomorrow  Glue will likely peel off in 1-3 weeks     Discharge wound care:    Complete by:  As directed   Ok to shower tomorrow  Glue will likely peel off in 1-3 weeks     Increase activity slowly    Complete by:  As directed      Increase activity slowly    Complete by:  As directed      Lifting restrictions    Complete  by:  As directed   Do not lift more than 20 pounds for 3-4 weeks     Lifting restrictions    Complete by:  As directed   Do not lift more than 20 pounds for 3-4 weeks            Medication List    TAKE these medications        acetaminophen 325 MG tablet  Commonly known as:  TYLENOL  Take 650 mg by mouth every 6 (six) hours as needed.     ALEVE 220 MG Caps  Generic drug:  Naproxen Sodium  Take 440 mg by mouth every 8 (eight) hours as needed (For pain.).     levonorgestrel-ethinyl estradiol 0.1-20 MG-MCG tablet  Commonly known as:  AVIANE,ALESSE,LESSINA  Take 1 tablet by mouth daily.     omeprazole 40 MG capsule  Commonly known as:  PRILOSEC  Take 1 capsule (40 mg total) by mouth daily.     oxyCODONE-acetaminophen 5-325 MG tablet  Commonly known as:  ROXICET   Take 1 tablet by mouth every 4 (four) hours as needed for severe pain.           Follow-up Information    Follow up with Rodman Pickle, MD In 3 weeks.   Specialty:  General Surgery   Contact information:   [address] [place] [address] [phone]       Signed: De Blanch Redina Zeller 03/06/2016, 6:04 PM

## 2016-03-06 NOTE — H&P (View-Only) (Signed)
                                                                           [facility] Consult: 2:28 PM 03/05/2016     Referring Provider: Dr Kinsinger  Primary Care Physician:  Megan Johnson, DO Primary Gastroenterologist:  Dr. Stark     Reason for Consultation:  Choledocholithias on IOC today   HPI: Dawn Morgan is a 30 y.o. female.  Hs SS trait, GERD.  Underwent lap chole today for 4 to 6 weeks of intermittent RUQ/epigastric pain unresponsive to PPI, elevated LFTs (tibil 2.3, alk phos 226, AST 247, ALT 515).   Ultrasound 5/18: GB stone, sludge, acute cholecystitis.  CBD 7.1 mm, mild intrahepatic duct dilatation.  Did not undergo MRCP  Today at lap chole, IOC: GB inflamed, multiple gallstones, small non-obstructing stones in CBD. LFTs today: T bili 1.3, otherwise normal LFTs.         Past Medical History  Diagnosis Date  . Sickle cell trait (HCC)   . GERD (gastroesophageal reflux disease)   . Choledocholithiasis   . Elevated LFTs   . Right upper quadrant abdominal pain     Past Surgical History  Procedure Laterality Date  . No past surgeries      Prior to Admission medications   Medication Sig Start Date End Date Taking? Authorizing Provider  acetaminophen (TYLENOL) 325 MG tablet Take 650 mg by mouth every 6 (six) hours as needed.   Yes Historical Provider, MD  levonorgestrel-ethinyl estradiol (AVIANE,ALESSE,LESSINA) 0.1-20 MG-MCG tablet Take 1 tablet by mouth daily. Patient taking differently: Take 1 tablet by mouth every evening.  11/28/15  Yes Megan P Johnson, DO  Naproxen Sodium (ALEVE) 220 MG CAPS Take 440 mg by mouth every 8 (eight) hours as needed (For pain.).    Yes Historical Provider, MD  omeprazole (PRILOSEC) 40 MG capsule Take 1 capsule (40 mg total) by mouth daily. Patient taking differently: Take 40 mg by mouth every morning.  01/12/16  Yes Megan P  Johnson, DO  oxyCODONE-acetaminophen (ROXICET) 5-325 MG tablet Take 1 tablet by mouth every 4 (four) hours as needed for severe pain. 03/05/16   Luke Aaron Kinsinger, MD    Scheduled Meds: . cefoTEtan in Dextrose 5%      . HYDROmorphone       Infusions: . lactated ringers 50 mL/hr at 03/05/16 1028   PRN Meds: HYDROcodone-acetaminophen, HYDROmorphone (DILAUDID) injection, ketorolac   Allergies as of 02/26/2016  . (No Known Allergies)    Family History  Problem Relation Age of Onset  . Diabetes Father   . Arthritis Maternal Grandmother   . Colon cancer Neg Hx     Social History   Social History  . Marital Status: Married    Spouse Name: Wellman  . Number of Children: 3  . Years of Education: N/A   Occupational History  . Pastor    Social History Main Topics  . Smoking status: Never Smoker   . Smokeless tobacco: Never Used  . Alcohol Use: Yes     Comment: OCCASIONAL  . Drug Use: No  . Sexual Activity: Yes    Birth Control/ Protection: Pill   Other Topics Concern  . Not   on file   Social History Narrative    REVIEW OF SYSTEMS: Constitutional:  No weakness or falls ENT:  No nose bleeds Pulm:  No cough or sob CV:  No palpitations, no LE edema.  GU:  No hematuria, no frequency.  No tea colored urine GI:  No dysphagia Heme:  No excessive bleeding   Transfusions:  No transfusions in Epic records Neuro:  No headaches, no peripheral tingling or numbness Derm:  No itching, no rash or sores.  Endocrine:  No sweats or chills.  No polyuria or dysuria Immunization:  Not queried Travel:  None beyond local counties in last few months.    PHYSICAL EXAM: Vital signs in last 24 hours: Filed Vitals:   03/05/16 1330 03/05/16 1345  BP: 117/84 115/83  Pulse: 68 61  Temp:    Resp: 16 19   Wt Readings from Last 3 Encounters:  03/05/16 64.411 kg (142 lb)  02/21/16 64.411 kg (142 lb)  02/20/16 64.955 kg (143 lb 3.2 oz)    General: sleepy/drowsy post op in  PACU Head:  No swelling, no asymmetry  Eyes:  No icterus or pallor Ears:  Not HOH  Nose:  No discharge Mouth:  Moist, clear, good teeth Neck:  No mass, TMG or JVD Lungs:  Clear bil.  No cough or dyspnea. Heart: RRR, slight tachy in low 100s Abdomen:  BS not present, soft, not distended.  Glued/intact port scars.  Tender right abdomen.  .   Rectal: deferred   Musc/Skeltl: no joint swelling or deformity Extremities:  No CCE.  Feet are warm.    Neurologic:  Oriented to self and place.  Drowsy post op.   Skin:  No rash, no sores, no jaundice   Psych:  Resting quietly, under sedative effect.   Intake/Output from previous day:   Intake/Output this shift: Total I/O In: 1400 [I.V.:1400] Out: 40 [Blood:40]  LAB RESULTS:  Recent Labs  03/05/16 1011  WBC 3.8*  HGB 11.3*  HCT 35.0*  PLT 402*   BMET Lab Results  Component Value Date   NA 140 03/05/2016   NA 140 02/20/2016   K 3.4* 03/05/2016   K 4.7 02/20/2016   CL 108 03/05/2016   CL 101 02/20/2016   CO2 26 03/05/2016   CO2 24 02/20/2016   GLUCOSE 96 03/05/2016   GLUCOSE 87 02/20/2016   BUN 9 03/05/2016   BUN 11 02/20/2016   CREATININE 0.76 03/05/2016   CREATININE 0.81 02/20/2016   CALCIUM 9.4 03/05/2016   CALCIUM 9.2 02/20/2016   LFT  Recent Labs  03/05/16 1011  PROT 7.5  ALBUMIN 3.9  AST 21  ALT 35  ALKPHOS 89  BILITOT 1.3*   PT/INR Lab Results  Component Value Date   INR 1.0 02/21/2016   Hepatitis Panel No results for input(s): HEPBSAG, HCVAB, HEPAIGM, HEPBIGM in the last 72 hours. C-Diff No components found for: CDIFF Lipase     Component Value Date/Time   LIPASE 85* 02/20/2016 0931    Drugs of Abuse  No results found for: LABOPIA, COCAINSCRNUR, LABBENZ, AMPHETMU, THCU, LABBARB   RADIOLOGY STUDIES: Dg Cholangiogram Operative  03/05/2016  CLINICAL DATA:  Cholelithiasis. EXAM: INTRAOPERATIVE CHOLANGIOGRAM TECHNIQUE: Cholangiographic images from the C-arm fluoroscopic device were submitted  for interpretation post-operatively. Please see the procedural report for the amount of contrast and the fluoroscopy time utilized. COMPARISON:  Ultrasound 02/22/2016 FINDINGS: There is a persistent round filling defect in the distal common bile duct. This is compatible with a distal common  bile duct stone. There is contrast getting into the duodenum. There is extravasation of contrast near the cystic duct cannulation site. No significant contrast refluxing into the common hepatic duct or intrahepatic ducts. IMPRESSION: Nonobstructive distal common bile duct stone. Contrast extravasation probably related to the cannulation site. Electronically Signed   By: Adam  Henn M.D.   On: 03/05/2016 12:47    ENDOSCOPIC STUDIES: none  IMPRESSION:   *  CBD stone on IOC.      PLAN:     *  Set up ERCP for 12:30 tomorrow with Dr Stark. Case d/w pt but will need to provide detailed informed consent to her in AM, when sedation wears off.  Headed out to speak with her spouse.  Dr Kinsinger will admit her (had planned discharge with outpt GI follow up)   Sarah Gribbin  03/05/2016, 2:28 PM Pager: [phone]      Attending physician's note   I have taken a history, examined the patient and reviewed the chart. I agree with the Advanced Practitioner's note, impression and recommendations. Pt is S/P lap chole today for symptomatic cholelithiasis with a filling defect identified in the distal CBD on IOC. She recently had elevated LFTs and mildly dilated CBD (7.1 mm) on US.  All findings typical for choledocholithiasis. I personally reviewed the IOC and abd US images. Recommend ERCP with sphincterotomy and stone extraction tomorrow at 1230 which could be performed as outpatient or inpatient. Risks, benefits, alternative to ERCP discussed with patient and she consents to proceed however she still is mildly sedated so will need to review risks and benefits again tomorrow morning with her. SG discussed ERCP and its risk and  benefits with her husband. Current plan is to convert to observation status.   Malcolm Stark, MD FACG [phone] Mon-Fri 8a-5p [phone] after 5p, weekends, holidays 

## 2016-03-07 ENCOUNTER — Encounter (HOSPITAL_COMMUNITY): Payer: Self-pay | Admitting: General Surgery

## 2016-03-20 NOTE — Telephone Encounter (Signed)
A user error has taken place.

## 2016-09-10 ENCOUNTER — Other Ambulatory Visit: Payer: BLUE CROSS/BLUE SHIELD

## 2016-09-10 ENCOUNTER — Other Ambulatory Visit: Payer: Self-pay

## 2016-09-10 ENCOUNTER — Ambulatory Visit (INDEPENDENT_AMBULATORY_CARE_PROVIDER_SITE_OTHER): Payer: BLUE CROSS/BLUE SHIELD

## 2016-09-10 DIAGNOSIS — Z23 Encounter for immunization: Secondary | ICD-10-CM | POA: Diagnosis not present

## 2016-09-10 DIAGNOSIS — Z021 Encounter for pre-employment examination: Secondary | ICD-10-CM

## 2016-09-11 ENCOUNTER — Encounter: Payer: Self-pay | Admitting: Family Medicine

## 2016-09-11 LAB — VARICELLA ZOSTER ANTIBODY, IGG: VARICELLA: 190 {index} (ref >165)

## 2016-09-12 ENCOUNTER — Telehealth: Payer: Self-pay | Admitting: Family Medicine

## 2016-09-12 NOTE — Telephone Encounter (Signed)
Patient notified

## 2016-09-12 NOTE — Telephone Encounter (Signed)
Patient would like to get the results from her recent labs.  Thanks

## 2016-09-12 NOTE — Telephone Encounter (Signed)
I sent her a letter. She is immune to chicken pox.

## 2016-09-13 DIAGNOSIS — Z23 Encounter for immunization: Secondary | ICD-10-CM | POA: Diagnosis not present

## 2016-12-05 ENCOUNTER — Other Ambulatory Visit: Payer: Self-pay | Admitting: Family Medicine

## 2017-02-04 ENCOUNTER — Encounter: Payer: Self-pay | Admitting: Family Medicine

## 2017-02-04 ENCOUNTER — Ambulatory Visit (INDEPENDENT_AMBULATORY_CARE_PROVIDER_SITE_OTHER): Payer: BLUE CROSS/BLUE SHIELD | Admitting: Family Medicine

## 2017-02-04 VITALS — BP 108/75 | HR 89 | Temp 98.4°F | Wt 153.5 lb

## 2017-02-04 DIAGNOSIS — B360 Pityriasis versicolor: Secondary | ICD-10-CM | POA: Diagnosis not present

## 2017-02-04 MED ORDER — KETOCONAZOLE 2 % EX SHAM: 1.0000 "application " | MEDICATED_SHAMPOO | CUTANEOUS | 1 refills | Status: DC

## 2017-02-04 NOTE — Progress Notes (Signed)
   BP 108/75 (BP Location: Left Arm, Patient Position: Sitting, Cuff Size: Normal)   Pulse 89   Temp 98.4 F (36.9 C)   Wt 153 lb 8 oz (69.6 kg)   SpO2 100%   BMI 24.40 kg/m    Subjective:    Patient ID: Dawn Morgan, female    DOB: [birth year], 31 y.o.   MRN: [id number]  HPI: Dawn Morgan is a 31 y.o. female  Chief Complaint  Patient presents with  . Rash    Patient states that it itches at times, and has started to spread, but only on her back   RASH Duration:  Several months  Location: back  Itching: yes Burning: no Redness: no Oozing: no Scaling: no Blisters: no Painful: no Fevers: no Change in detergents/soaps/personal care products: no Recent illness: no Recent travel:no History of same: no Context: stable Alleviating factors: lotion/moisturizer Treatments attempted:lotion/moisturizer Shortness of breath: no  Throat/tongue swelling: no Myalgias/arthralgias: no  Relevant past medical, surgical, family and social history reviewed and updated as indicated. Interim medical history since our last visit reviewed. Allergies and medications reviewed and updated.  Review of Systems  Constitutional: Negative.   Respiratory: Negative.   Cardiovascular: Negative.   Psychiatric/Behavioral: Negative.     Per HPI unless specifically indicated above     Objective:    BP 108/75 (BP Location: Left Arm, Patient Position: Sitting, Cuff Size: Normal)   Pulse 89   Temp 98.4 F (36.9 C)   Wt 153 lb 8 oz (69.6 kg)   SpO2 100%   BMI 24.40 kg/m   Wt Readings from Last 3 Encounters:  02/04/17 153 lb 8 oz (69.6 kg)  03/05/16 142 lb (64.4 kg)  02/21/16 142 lb (64.4 kg)    Physical Exam  Constitutional: She is oriented to person, place, and time. She appears well-developed and well-nourished. No distress.  HENT:  Head: Normocephalic and atraumatic.  Right Ear: Hearing normal.  Left Ear: Hearing normal.  Nose: Nose normal.  Eyes: Conjunctivae and lids are normal.  Right eye exhibits no discharge. Left eye exhibits no discharge. No scleral icterus.  Pulmonary/Chest: Effort normal. No respiratory distress.  Musculoskeletal: Normal range of motion.  Neurological: She is alert and oriented to person, place, and time.  Skin: Skin is warm, dry and intact. Rash (Patches of hyperpigmented areas on her back, not raised, no central clearing) noted. No erythema. No pallor.  Psychiatric: She has a normal mood and affect. Her speech is normal and behavior is normal. Judgment and thought content normal. Cognition and memory are normal.    Results for orders placed or performed in visit on 09/10/16  Varicella zoster antibody, IgG  Result Value Ref Range   Varicella zoster IgG 190 Immune >165 index      Assessment & Plan:   Problem List Items Addressed This Visit    None    Visit Diagnoses    Tinea versicolor    -  Primary   Will start kenoconazole shampoo. Call with any concerns.        Follow up plan: Return When she'd like, for Physical.

## 2017-02-04 NOTE — Patient Instructions (Addendum)
Tinea Versicolor Tinea versicolor is a common fungal infection of the skin. It causes a rash that appears as light or dark patches on the skin. The rash most often occurs on the chest, back, neck, or upper arms. This condition is more common during warm weather. Other than affecting how your skin looks, tinea versicolor usually does not cause other problems. In most cases, the infection goes away in a few weeks with treatment. It may take a few months for the patches on your skin to clear up. What are the causes? Tinea versicolor occurs when a type of fungus that is normally present on the skin starts to overgrow. This fungus is a kind of yeast. The exact cause of the overgrowth is not known. This condition cannot be passed from one person to another (noncontagious). What increases the risk? This condition is more likely to develop when certain factors are present, such as:  Heat and humidity.  Sweating too much.  Hormone changes.  Oily skin.  A weak defense (immune) system. What are the signs or symptoms? Symptoms of this condition may include:  A rash on your skin that is made up of light or dark patches. The rash may have:  Patches of tan or pink spots on light skin.  Patches of white or brown spots on dark skin.  Patches of skin that do not tan.  Well-marked edges.  Scales on the discolored areas.  Mild itching. How is this diagnosed? A health care provider can usually diagnose this condition by looking at your skin. During the exam, he or she may use ultraviolet light to help determine the extent of the infection. In some cases, a skin sample may be taken by scraping the rash. This sample will be viewed under a microscope to check for yeast overgrowth. How is this treated? Treatment for this condition may include:  Dandruff shampoo that is applied to the affected skin during showers or bathing.  Over-the-counter medicated skin cream, lotion, or soaps.  Prescription  antifungal medicine in the form of skin cream or pills.  Medicine to help reduce itching. Follow these instructions at home:  Take medicines only as directed by your health care provider.  Apply dandruff shampoo to the affected area if told to do so by your health care provider. You may be instructed to scrub the affected skin for several minutes each day.  Do not scratch the affected area of skin.  Avoid hot and humid conditions.  Do not use tanning booths.  Try to avoid sweating a lot. Contact a health care provider if:  Your symptoms get worse.  You have a fever.  You have redness, swelling, or pain at the site of your rash.  You have fluid, blood, or pus coming from your rash.  Your rash returns after treatment. This information is not intended to replace advice given to you by your health care provider. Make sure you discuss any questions you have with your health care provider. Document Released: 09/20/2000 Document Revised: 05/26/2016 Document Reviewed: 07/05/2014 Elsevier Interactive Patient Education  2017 [employer]  

## 2017-06-02 ENCOUNTER — Ambulatory Visit (INDEPENDENT_AMBULATORY_CARE_PROVIDER_SITE_OTHER): Payer: BLUE CROSS/BLUE SHIELD | Admitting: Family Medicine

## 2017-06-02 ENCOUNTER — Encounter: Payer: Self-pay | Admitting: Family Medicine

## 2017-06-02 VITALS — BP 116/80 | HR 80 | Temp 98.7°F | Ht 66.3 in | Wt 152.0 lb

## 2017-06-02 DIAGNOSIS — Z0000 Encounter for general adult medical examination without abnormal findings: Secondary | ICD-10-CM | POA: Diagnosis not present

## 2017-06-02 LAB — UA/M W/RFLX CULTURE, ROUTINE
Bilirubin, UA: NEGATIVE
Glucose, UA: NEGATIVE
LEUKOCYTES UA: NEGATIVE
NITRITE UA: NEGATIVE
Protein, UA: NEGATIVE
RBC, UA: NEGATIVE
Specific Gravity, UA: 1.02 (ref 1.005–1.030)
Urobilinogen, Ur: 0.2 mg/dL (ref 0.2–1.0)
pH, UA: 6.5 (ref 5.0–7.5)

## 2017-06-02 LAB — MICROSCOPIC EXAMINATION
BACTERIA UA: NONE SEEN
RBC MICROSCOPIC, UA: NONE SEEN /HPF (ref 0–?)

## 2017-06-02 MED ORDER — LEVONORGESTREL-ETHINYL ESTRAD 0.1-20 MG-MCG PO TABS: 1.0000 | ORAL_TABLET | Freq: Every day | ORAL | 13 refills | Status: DC

## 2017-06-02 MED ORDER — KETOCONAZOLE 2 % EX SHAM: 1.0000 "application " | MEDICATED_SHAMPOO | CUTANEOUS | 2 refills | Status: DC

## 2017-06-02 NOTE — Progress Notes (Signed)
BP 116/80 (BP Location: Left Arm, Patient Position: Sitting, Cuff Size: Normal)   Pulse 80   Temp 98.7 F (37.1 C)   Ht 5' 6.3" (1.684 m)   Wt 152 lb (68.9 kg)   LMP 05/05/2017 (Approximate)   SpO2 99%   BMI 24.31 kg/m    Subjective:    Patient ID: Dawn Morgan, female    DOB: September 11, 1986, 31 y.o.   MRN: [id number]  HPI: Dawn Morgan is a 31 y.o. female presenting on 06/02/2017 for comprehensive medical examination. Current medical complaints include:none  She currently lives with: husband and kids Menopausal Symptoms: no  Depression Screen done today and results listed below:  Depression screen PHQ 2/9 02/04/2017  Decreased Interest 0  Down, Depressed, Hopeless 0  PHQ - 2 Score 0    Past Medical History:  Past Medical History:  Diagnosis Date  . Choledocholithiasis   . Elevated LFTs   . GERD (gastroesophageal reflux disease)   . Right upper quadrant abdominal pain   . Sickle cell trait [facility])     Surgical History:  Past Surgical History:  Procedure Laterality Date  . CHOLECYSTECTOMY N/A 03/05/2016   Procedure: LAPAROSCOPIC CHOLECYSTECTOMY WITH INTRAOPERATIVE CHOLANGIOGRAM;  Surgeon: Rodman Pickle, MD;  Location: [facility] OR;  Service: General;  Laterality: N/A;  . ERCP N/A 03/06/2016   Procedure: ENDOSCOPIC RETROGRADE CHOLANGIOPANCREATOGRAPHY (ERCP);  Surgeon: Meryl Dare, MD;  Location: [facility] ENDOSCOPY;  Service: Endoscopy;  Laterality: N/A;  . NO PAST SURGERIES      Medications:  Current Outpatient Prescriptions on File Prior to Visit  Medication Sig  . acetaminophen (TYLENOL) 325 MG tablet Take 650 mg by mouth every 6 (six) hours as needed.   No current facility-administered medications on file prior to visit.     Allergies:  No Known Allergies  Social History:  Social History   Social History  . Marital status: Married    Spouse name: Simeon Craft  . Number of children: 3  . Years of education: N/A   Occupational History  . Pastor    Social History  Main Topics  . Smoking status: Never Smoker  . Smokeless tobacco: Never Used  . Alcohol use Yes     Comment: OCCASIONAL  . Drug use: No  . Sexual activity: Yes    Birth control/ protection: Pill   Other Topics Concern  . Not on file   Social History Narrative  . No narrative on file   History  Smoking Status  . Never Smoker  Smokeless Tobacco  . Never Used   History  Alcohol Use  . Yes    Comment: OCCASIONAL    Family History:  Family History  Problem Relation Age of Onset  . Diabetes Father   . Arthritis Maternal Grandmother   . Colon cancer Neg Hx     Past medical history, surgical history, medications, allergies, family history and social history reviewed with patient today and changes made to appropriate areas of the chart.   Review of Systems  Constitutional: Negative.   HENT: Negative.   Eyes: Negative.   Respiratory: Negative.   Cardiovascular: Negative.   Gastrointestinal: Negative.   Genitourinary: Negative.   Musculoskeletal: Negative.   Skin: Negative.   Neurological: Positive for dizziness. Negative for tingling, tremors, sensory change, speech change, focal weakness, seizures, loss of consciousness and headaches.  Endo/Heme/Allergies: Negative.  Negative for environmental allergies and polydipsia. Does not bruise/bleed easily.  Psychiatric/Behavioral: Negative.     All other ROS negative except what  is listed above and in the HPI.      Objective:    BP 116/80 (BP Location: Left Arm, Patient Position: Sitting, Cuff Size: Normal)   Pulse 80   Temp 98.7 F (37.1 C)   Ht 5' 6.3" (1.684 m)   Wt 152 lb (68.9 kg)   LMP 05/05/2017 (Approximate)   SpO2 99%   BMI 24.31 kg/m   Wt Readings from Last 3 Encounters:  06/02/17 152 lb (68.9 kg)  02/04/17 153 lb 8 oz (69.6 kg)  03/05/16 142 lb (64.4 kg)    Physical Exam  Constitutional: She is oriented to person, place, and time. She appears well-developed and well-nourished. No distress.  HENT:    Head: Normocephalic and atraumatic.  Right Ear: Hearing, tympanic membrane, external ear and ear canal normal.  Left Ear: Hearing, tympanic membrane, external ear and ear canal normal.  Nose: Nose normal.  Mouth/Throat: Uvula is midline, oropharynx is clear and moist and mucous membranes are normal. No oropharyngeal exudate.  Eyes: Pupils are equal, round, and reactive to light. Conjunctivae, EOM and lids are normal. Right eye exhibits no discharge. Left eye exhibits no discharge. No scleral icterus.  Neck: Normal range of motion. Neck supple. No JVD present. No tracheal deviation present. No thyromegaly present.  Cardiovascular: Normal rate, regular rhythm, normal heart sounds and intact distal pulses.  Exam reveals no gallop and no friction rub.   No murmur heard. Pulmonary/Chest: Effort normal and breath sounds normal. No stridor. No respiratory distress. She has no wheezes. She has no rales. She exhibits no tenderness. Right breast exhibits no inverted nipple, no mass, no nipple discharge, no skin change and no tenderness. Left breast exhibits no inverted nipple, no mass, no nipple discharge, no skin change and no tenderness. Breasts are symmetrical.  Abdominal: Soft. Bowel sounds are normal. She exhibits no distension and no mass. There is no tenderness. There is no rebound and no guarding.  Genitourinary:  Genitourinary Comments: Pelvic exam deferred with shared decision making.   Musculoskeletal: Normal range of motion. She exhibits no edema, tenderness or deformity.  Lymphadenopathy:    She has no cervical adenopathy.  Neurological: She is alert and oriented to person, place, and time. She has normal reflexes. She displays normal reflexes. No cranial nerve deficit. She exhibits normal muscle tone. Coordination normal.  Skin: Skin is warm, dry and intact. No rash noted. She is not diaphoretic. No erythema. No pallor.  Tinea versicolor on her back  Psychiatric: She has a normal mood and  affect. Her speech is normal and behavior is normal. Judgment and thought content normal. Cognition and memory are normal.  Nursing note and vitals reviewed.   Results for orders placed or performed in visit on 09/10/16  Varicella zoster antibody, IgG  Result Value Ref Range   Varicella zoster IgG 190 Immune >165 index      Assessment & Plan:   Problem List Items Addressed This Visit    None    Visit Diagnoses    Routine general medical examination at a health care facility    -  Primary   Vaccines up to date. Screening labs checked today. Pap up to date. Continue diet and exercise. Recheck 1 year.    Relevant Orders   CBC with Differential/Platelet   Comprehensive metabolic panel   Lipid Panel w/o Chol/HDL Ratio   TSH   UA/M w/rflx Culture, Routine       Follow up plan: Return in about 1 year (around 06/02/2018)  for Physical.   LABORATORY TESTING:  - Pap smear: up to date  IMMUNIZATIONS:   - Tdap: Tetanus vaccination status reviewed: last tetanus booster within 10 years. - Influenza: Up to date - Pneumovax: Not applicable  PATIENT COUNSELING:   Advised to take 1 mg of folate supplement per day if capable of pregnancy.   Sexuality: Discussed sexually transmitted diseases, partner selection, use of condoms, avoidance of unintended pregnancy  and contraceptive alternatives.   Advised to avoid cigarette smoking.  I discussed with the patient that most people either abstain from alcohol or drink within safe limits (<=14/week and <=4 drinks/occasion for males, <=7/weeks and <= 3 drinks/occasion for females) and that the risk for alcohol disorders and other health effects rises proportionally with the number of drinks per week and how often a drinker exceeds daily limits.  Discussed cessation/primary prevention of drug use and availability of treatment for abuse.   Diet: Encouraged to adjust caloric intake to maintain  or achieve ideal body weight, to reduce intake of  dietary saturated fat and total fat, to limit sodium intake by avoiding high sodium foods and not adding table salt, and to maintain adequate dietary potassium and calcium preferably from fresh fruits, vegetables, and low-fat dairy products.    stressed the importance of regular exercise  Injury prevention: Discussed safety belts, safety helmets, smoke detector, smoking near bedding or upholstery.   Dental health: Discussed importance of regular tooth brushing, flossing, and dental visits.    NEXT PREVENTATIVE PHYSICAL DUE IN 1 YEAR. Return in about 1 year (around 06/02/2018) for Physical.

## 2017-06-02 NOTE — Patient Instructions (Addendum)

## 2017-06-03 LAB — COMPREHENSIVE METABOLIC PANEL
A/G RATIO: 1.4 (ref 1.2–2.2)
ALT: 31 IU/L (ref 0–32)
AST: 25 IU/L (ref 0–40)
Albumin: 4.3 g/dL (ref 3.5–5.5)
Alkaline Phosphatase: 56 IU/L (ref 39–117)
BUN / CREAT RATIO: 10 (ref 9–23)
BUN: 8 mg/dL (ref 6–20)
Bilirubin Total: 0.8 mg/dL (ref 0.0–1.2)
CALCIUM: 9.2 mg/dL (ref 8.7–10.2)
CO2: 23 mmol/L (ref 20–29)
CREATININE: 0.81 mg/dL (ref 0.57–1.00)
Chloride: 104 mmol/L (ref 96–106)
GFR, EST AFRICAN AMERICAN: 110 mL/min/{1.73_m2} (ref >59)
GFR, EST NON AFRICAN AMERICAN: 96 mL/min/{1.73_m2} (ref >59)
Globulin, Total: 3 g/dL (ref 1.5–4.5)
Glucose: 81 mg/dL (ref 65–99)
POTASSIUM: 4.5 mmol/L (ref 3.5–5.2)
SODIUM: 142 mmol/L (ref 134–144)
Total Protein: 7.3 g/dL (ref 6.0–8.5)

## 2017-06-03 LAB — LIPID PANEL W/O CHOL/HDL RATIO
Cholesterol, Total: 151 mg/dL (ref 100–199)
HDL: 52 mg/dL (ref >39)
LDL Calculated: 91 mg/dL (ref 0–99)
TRIGLYCERIDES: 41 mg/dL (ref 0–149)
VLDL CHOLESTEROL CAL: 8 mg/dL (ref 5–40)

## 2017-06-03 LAB — CBC WITH DIFFERENTIAL/PLATELET
Basophils Absolute: 0 10*3/uL (ref 0.0–0.2)
Basos: 0 %
EOS (ABSOLUTE): 0 10*3/uL (ref 0.0–0.4)
Eos: 0 %
Hematocrit: 36.3 % (ref 34.0–46.6)
Hemoglobin: 11.8 g/dL (ref 11.1–15.9)
IMMATURE GRANULOCYTES: 0 %
Immature Grans (Abs): 0 10*3/uL (ref 0.0–0.1)
Lymphocytes Absolute: 1.9 10*3/uL (ref 0.7–3.1)
Lymphs: 43 %
MCH: 26.3 pg — ABNORMAL LOW (ref 26.6–33.0)
MCHC: 32.5 g/dL (ref 31.5–35.7)
MCV: 81 fL (ref 79–97)
Monocytes Absolute: 0.3 10*3/uL (ref 0.1–0.9)
Monocytes: 8 %
NEUTROS PCT: 49 %
Neutrophils Absolute: 2.2 10*3/uL (ref 1.4–7.0)
PLATELETS: 300 10*3/uL (ref 150–379)
RBC: 4.48 x10E6/uL (ref 3.77–5.28)
RDW: 14.7 % (ref 12.3–15.4)
WBC: 4.5 10*3/uL (ref 3.4–10.8)

## 2017-06-03 LAB — TSH: TSH: 0.925 u[IU]/mL (ref 0.450–4.500)

## 2017-11-01 IMAGING — US US ABDOMEN COMPLETE
1 series · 13 of 25 positions shown · non-contrast
Comparison: None.

CLINICAL DATA: Elevated LFTs.  Right upper quadrant pain.

EXAM:
ABDOMEN ULTRASOUND COMPLETE

[Series 1: us abdomen complete · 0.22mm/px · 13 of 99 slices shown]
[im 1/99]
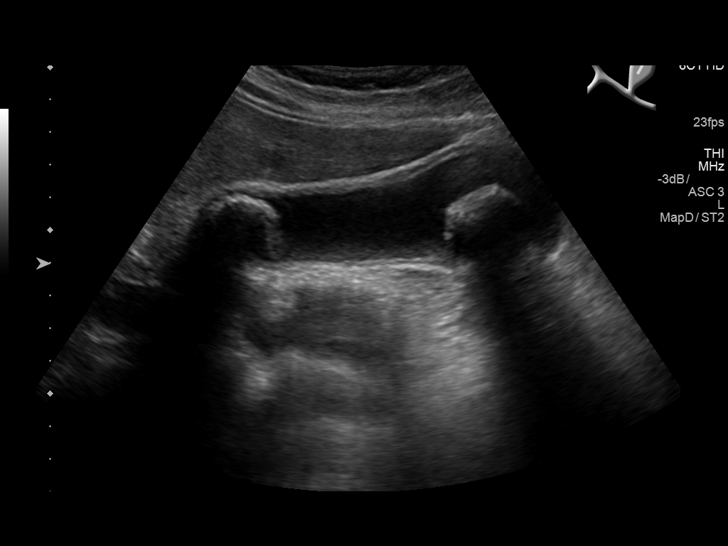
[im 9/99]
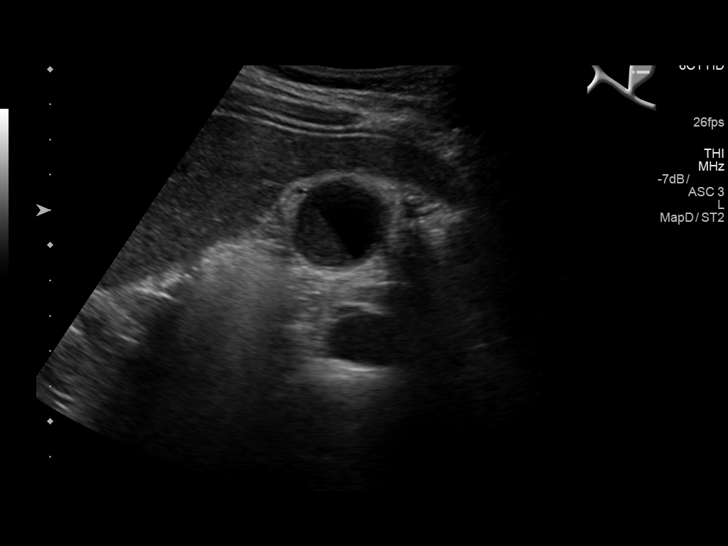
[im 17/99]
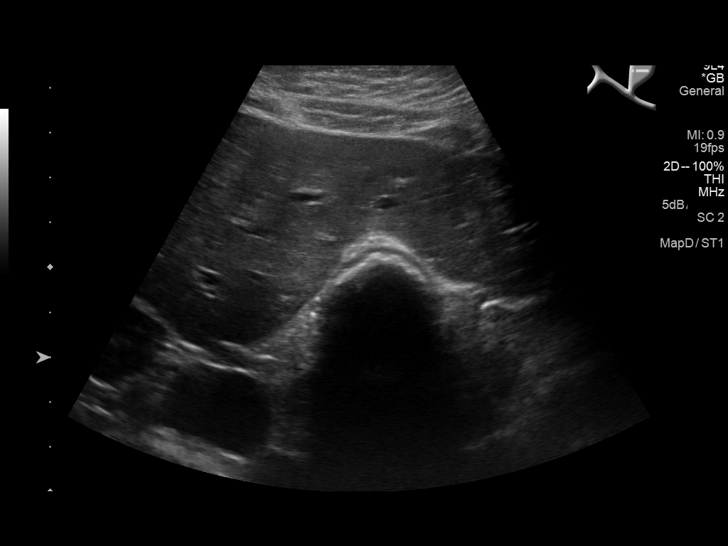
[im 25/99]
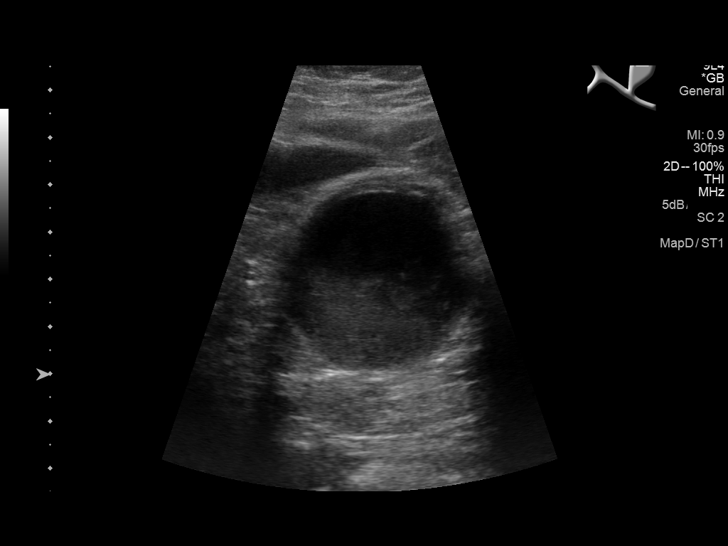
[im 33/99]
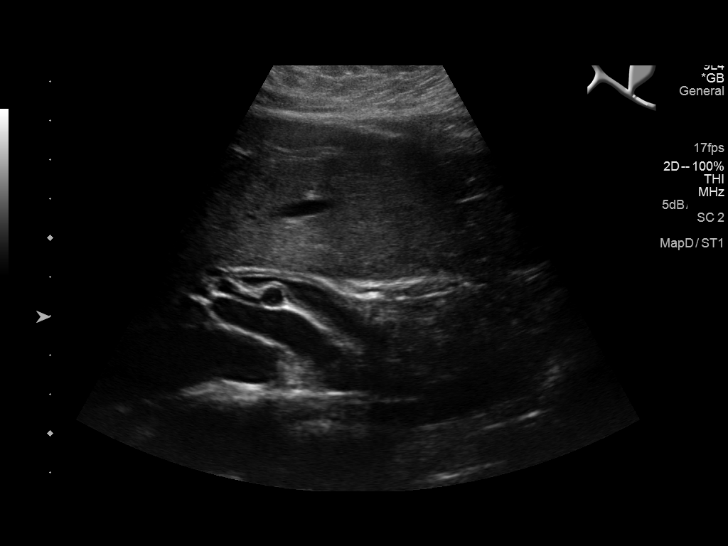
[im 41/99]
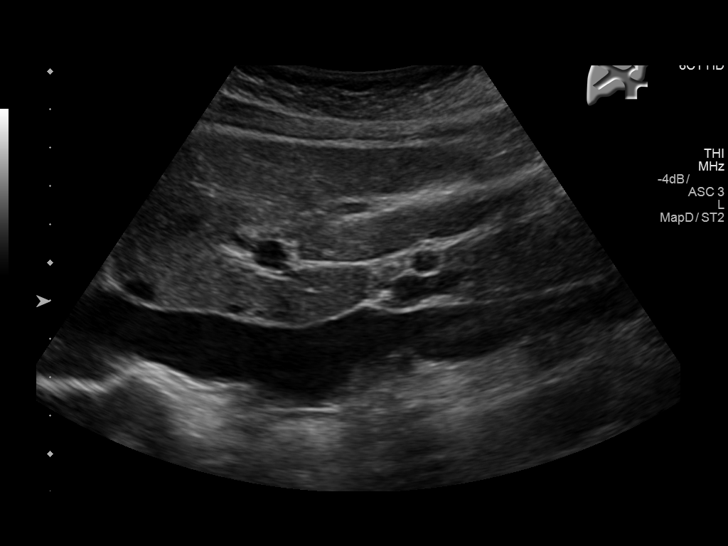
[im 50/99]
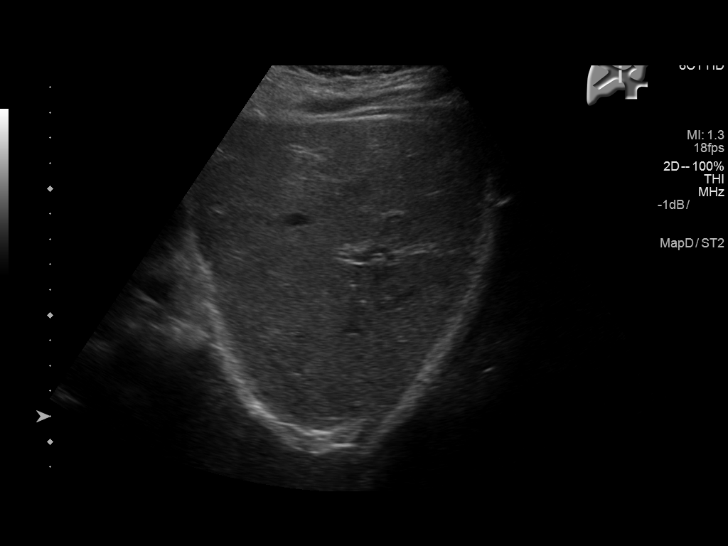
[im 58/99]
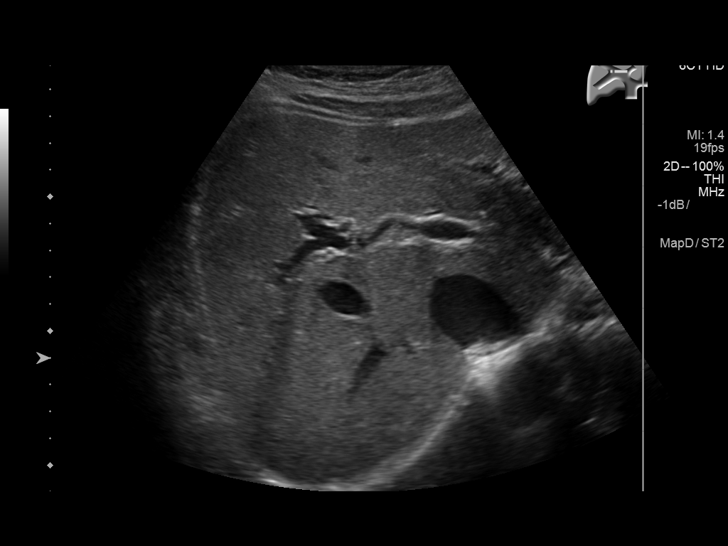
[im 66/99]
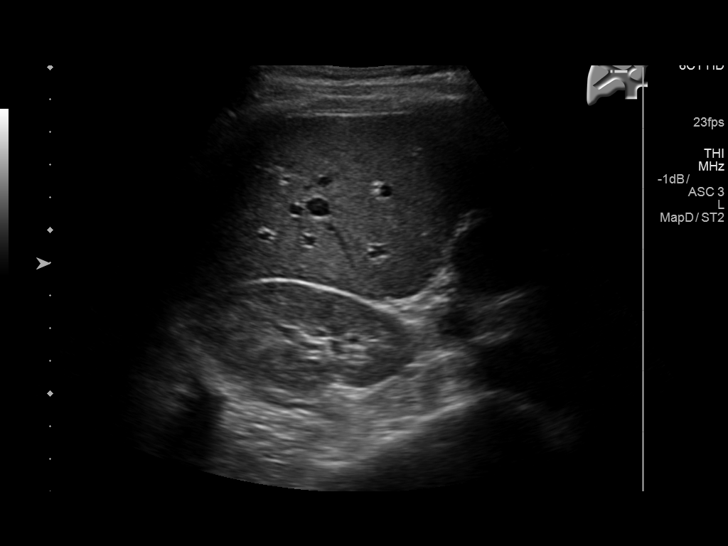
[im 74/99]
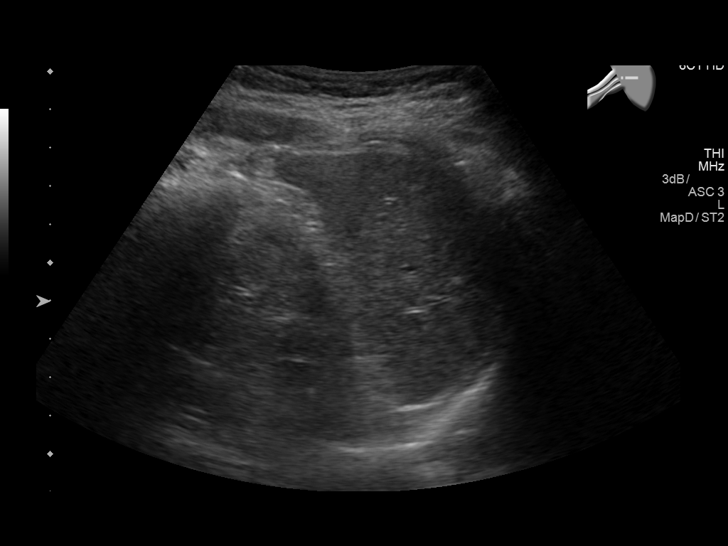
[im 82/99]
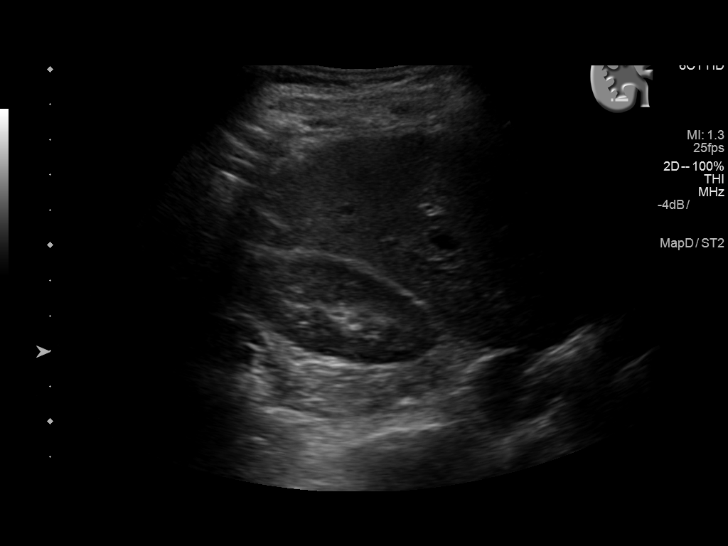
[im 90/99]
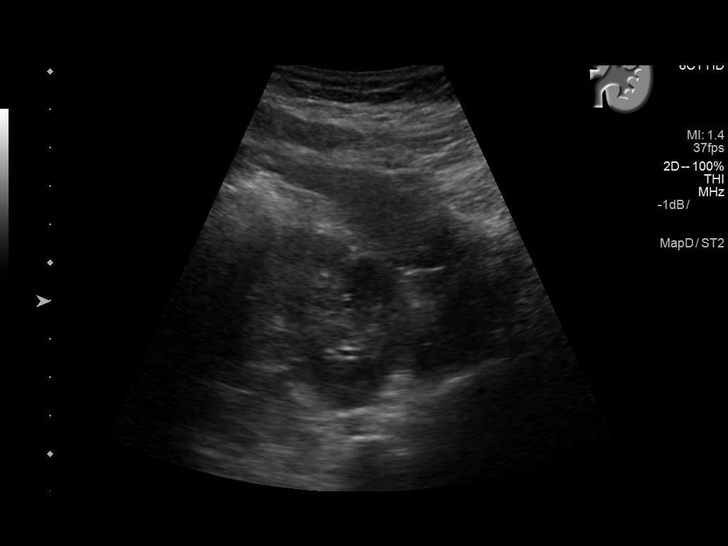
[im 99/99]
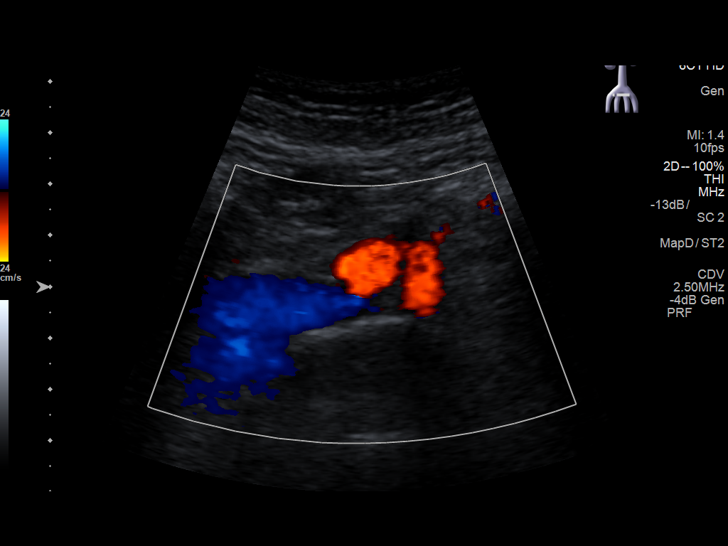

[13 of 25 positions shown; findings below may reference images not displayed]

FINDINGS: Gallbladder: Sludge and 2 large stones are identified within the
gallbladder. These measure up to 2.5 cm. Mild diffuse gallbladder
wall thickening is noted which measures up to 5.2 mm.

Common bile duct: Diameter: 7.1 mm.

Liver: No focal lesion identified. Within normal limits in
parenchymal echogenicity. Intrahepatic biliary dilatation noted.

IVC: No abnormality visualized.

Pancreas: Visualized portion unremarkable.

Spleen: Size and appearance within normal limits.

Right Kidney: Length: 10.8 cm. Echogenicity within normal limits. No
mass or hydronephrosis visualized.

Left Kidney: Length: 9.3 cm.. Echogenicity within normal limits. No
mass or hydronephrosis visualized.

Abdominal aorta: No aneurysm visualized.

Other findings: None.
IMPRESSION: 1. Gallstones, gallbladder sludge, and gallbladder wall thickening.
Findings are compatible with acute cholecystitis.
2. Mild increase caliber of the common bile duct and mild
intrahepatic bile duct dilatation. If there is a clinical concern
for choledocholithiasis and MRCP may be helpful for further
assessment.
These results will be called to the ordering clinician or
representative by the Radiologist Assistant, and communication
documented in the PACS or zVision Dashboard.

## 2017-11-03 ENCOUNTER — Ambulatory Visit (INDEPENDENT_AMBULATORY_CARE_PROVIDER_SITE_OTHER): Payer: BLUE CROSS/BLUE SHIELD | Admitting: Family Medicine

## 2017-11-03 ENCOUNTER — Encounter: Payer: Self-pay | Admitting: Family Medicine

## 2017-11-03 VITALS — BP 115/76 | HR 71 | Temp 98.8°F | Ht 66.0 in | Wt 151.5 lb

## 2017-11-03 DIAGNOSIS — Z131 Encounter for screening for diabetes mellitus: Secondary | ICD-10-CM | POA: Diagnosis not present

## 2017-11-03 DIAGNOSIS — K219 Gastro-esophageal reflux disease without esophagitis: Secondary | ICD-10-CM

## 2017-11-03 DIAGNOSIS — Z130 Encounter for screening for diseases of the blood and blood-forming organs and certain disorders involving the immune mechanism: Secondary | ICD-10-CM

## 2017-11-03 DIAGNOSIS — Z113 Encounter for screening for infections with a predominantly sexual mode of transmission: Secondary | ICD-10-CM | POA: Diagnosis not present

## 2017-11-03 DIAGNOSIS — Z23 Encounter for immunization: Secondary | ICD-10-CM | POA: Diagnosis not present

## 2017-11-03 DIAGNOSIS — Z13220 Encounter for screening for lipoid disorders: Secondary | ICD-10-CM

## 2017-11-03 LAB — UA/M W/RFLX CULTURE, ROUTINE
Bilirubin, UA: NEGATIVE
Glucose, UA: NEGATIVE
Ketones, UA: NEGATIVE
Leukocytes, UA: NEGATIVE
NITRITE UA: NEGATIVE
PH UA: 7 (ref 5.0–7.5)
Protein, UA: NEGATIVE
RBC UA: NEGATIVE
Specific Gravity, UA: 1.015 (ref 1.005–1.030)
Urobilinogen, Ur: 0.2 mg/dL (ref 0.2–1.0)

## 2017-11-03 LAB — BAYER DCA HB A1C WAIVED: HB A1C (BAYER DCA - WAIVED): 5.2 % (ref <7.0)

## 2017-11-03 NOTE — Patient Instructions (Addendum)

## 2017-11-03 NOTE — Assessment & Plan Note (Signed)
Under good control. No issues. Continue to work on diet. Call with any concerns.

## 2017-11-03 NOTE — Progress Notes (Signed)
BP 115/76 (BP Location: Left Arm, Patient Position: Sitting, Cuff Size: Normal)   Pulse 71   Temp 98.8 F (37.1 C)   Ht 5\' 6"  (1.676 m)   Wt 151 lb 8 oz (68.7 kg)   SpO2 100%   BMI 24.45 kg/m    Subjective:    Patient ID: Dawn Morgan, female    DOB: 1986/01/09, 32 y.o.   MRN: [id number]  HPI: Dawn Morgan is a 32 y.o. female here today to have a form filled out for work.   Chief Complaint  Patient presents with  . Pre-employment Exam   Doing well. No concerns. Needs a form filled out for work.   Relevant past medical, surgical, family and social history reviewed and updated as indicated. Interim medical history since our last visit reviewed. Allergies and medications reviewed and updated.  Review of Systems  Constitutional: Positive for diaphoresis. Negative for activity change, appetite change, chills, fatigue, fever and unexpected weight change.  HENT: Negative.   Eyes: Negative.   Respiratory: Negative.   Cardiovascular: Negative.   Gastrointestinal: Negative.        Heartburn with what she eats  Endocrine: Negative.   Genitourinary: Negative.   Musculoskeletal: Negative.   Skin: Negative.   Allergic/Immunologic: Negative.   Neurological: Negative.   Hematological: Negative.   Psychiatric/Behavioral: Negative.     Per HPI unless specifically indicated above     Objective:    BP 115/76 (BP Location: Left Arm, Patient Position: Sitting, Cuff Size: Normal)   Pulse 71   Temp 98.8 F (37.1 C)   Ht 5\' 6"  (1.676 m)   Wt 151 lb 8 oz (68.7 kg)   SpO2 100%   BMI 24.45 kg/m   Wt Readings from Last 3 Encounters:  11/03/17 151 lb 8 oz (68.7 kg)  06/02/17 152 lb (68.9 kg)  02/04/17 153 lb 8 oz (69.6 kg)    Physical Exam  Constitutional: She is oriented to person, place, and time. She appears well-developed and well-nourished. No distress.  HENT:  Head: Normocephalic and atraumatic.  Right Ear: Hearing, tympanic membrane, external ear and ear canal normal.    Left Ear: Hearing, tympanic membrane, external ear and ear canal normal.  Nose: Nose normal.  Mouth/Throat: Uvula is midline, oropharynx is clear and moist and mucous membranes are normal. No oropharyngeal exudate.  Eyes: Conjunctivae, EOM and lids are normal. Pupils are equal, round, and reactive to light. Right eye exhibits no discharge. Left eye exhibits no discharge. No scleral icterus.  Neck: Normal range of motion. Neck supple. No JVD present. No tracheal deviation present. No thyromegaly present.  Cardiovascular: Normal rate, regular rhythm, normal heart sounds and intact distal pulses. Exam reveals no gallop and no friction rub.  No murmur heard. Pulmonary/Chest: Effort normal and breath sounds normal. No stridor. No respiratory distress. She has no wheezes. She has no rales. She exhibits no tenderness.  Abdominal: Soft. Bowel sounds are normal. She exhibits no distension and no mass. There is no tenderness. There is no rebound and no guarding.  Genitourinary:  Genitourinary Comments: Breast and pelvic exams deferred with shared decision making  Musculoskeletal: Normal range of motion. She exhibits no edema, tenderness or deformity.  Lymphadenopathy:    She has no cervical adenopathy.  Neurological: She is alert and oriented to person, place, and time. She has normal reflexes. She displays normal reflexes. No cranial nerve deficit. She exhibits normal muscle tone. Coordination normal.  Skin: Skin is warm, dry and intact. No  rash noted. She is not diaphoretic. No erythema. No pallor.  Psychiatric: She has a normal mood and affect. Her speech is normal and behavior is normal. Judgment and thought content normal. Cognition and memory are normal.  Nursing note and vitals reviewed.   Results for orders placed or performed in visit on 06/02/17  Microscopic Examination  Result Value Ref Range   WBC, UA 0-5 0 - 5 /hpf   RBC, UA None seen 0 - 2 /hpf   Epithelial Cells (non renal) 0-10 0 -  10 /hpf   Bacteria, UA None seen None seen/Few  CBC with Differential/Platelet  Result Value Ref Range   WBC 4.5 3.4 - 10.8 x10E3/uL   RBC 4.48 3.77 - 5.28 x10E6/uL   Hemoglobin 11.8 11.1 - 15.9 g/dL   Hematocrit 16.1 09.6 - 46.6 %   MCV 81 79 - 97 fL   MCH 26.3 (L) 26.6 - 33.0 pg   MCHC 32.5 31.5 - 35.7 g/dL   RDW 04.5 40.9 - 81.1 %   Platelets 300 150 - 379 x10E3/uL   Neutrophils 49 Not Estab. %   Lymphs 43 Not Estab. %   Monocytes 8 Not Estab. %   Eos 0 Not Estab. %   Basos 0 Not Estab. %   Neutrophils Absolute 2.2 1.4 - 7.0 x10E3/uL   Lymphocytes Absolute 1.9 0.7 - 3.1 x10E3/uL   Monocytes Absolute 0.3 0.1 - 0.9 x10E3/uL   EOS (ABSOLUTE) 0.0 0.0 - 0.4 x10E3/uL   Basophils Absolute 0.0 0.0 - 0.2 x10E3/uL   Immature Granulocytes 0 Not Estab. %   Immature Grans (Abs) 0.0 0.0 - 0.1 x10E3/uL  Comprehensive metabolic panel  Result Value Ref Range   Glucose 81 65 - 99 mg/dL   BUN 8 6 - 20 mg/dL   Creatinine, Ser 9.14 0.57 - 1.00 mg/dL   GFR calc non Af Amer 96 >59 mL/min/1.73   GFR calc Af Amer 110 >59 mL/min/1.73   BUN/Creatinine Ratio 10 9 - 23   Sodium 142 134 - 144 mmol/L   Potassium 4.5 3.5 - 5.2 mmol/L   Chloride 104 96 - 106 mmol/L   CO2 23 20 - 29 mmol/L   Calcium 9.2 8.7 - 10.2 mg/dL   Total Protein 7.3 6.0 - 8.5 g/dL   Albumin 4.3 3.5 - 5.5 g/dL   Globulin, Total 3.0 1.5 - 4.5 g/dL   Albumin/Globulin Ratio 1.4 1.2 - 2.2   Bilirubin Total 0.8 0.0 - 1.2 mg/dL   Alkaline Phosphatase 56 39 - 117 IU/L   AST 25 0 - 40 IU/L   ALT 31 0 - 32 IU/L  Lipid Panel w/o Chol/HDL Ratio  Result Value Ref Range   Cholesterol, Total 151 100 - 199 mg/dL   Triglycerides 41 0 - 149 mg/dL   HDL 52 >78 mg/dL   VLDL Cholesterol Cal 8 5 - 40 mg/dL   LDL Calculated 91 0 - 99 mg/dL  TSH  Result Value Ref Range   TSH 0.925 0.450 - 4.500 uIU/mL  UA/M w/rflx Culture, Routine  Result Value Ref Range   Specific Gravity, UA 1.020 1.005 - 1.030   pH, UA 6.5 5.0 - 7.5   Color, UA Orange  Yellow   Appearance Ur Cloudy (A) Clear   Leukocytes, UA Negative Negative   Protein, UA Negative Negative/Trace   Glucose, UA Negative Negative   Ketones, UA Trace (A) Negative   RBC, UA Negative Negative   Bilirubin, UA Negative Negative   Urobilinogen,  Ur 0.2 0.2 - 1.0 mg/dL   Nitrite, UA Negative Negative   Microscopic Examination See below:       Assessment & Plan:   Problem List Items Addressed This Visit      Digestive   GERD (gastroesophageal reflux disease) - Primary    Under good control. No issues. Continue to work on diet. Call with any concerns.       Relevant Orders   CBC with Differential/Platelet   Comprehensive metabolic panel   TSH    Other Visit Diagnoses    Immunization due       Flu shot given today.   Relevant Orders   Flu Vaccine QUAD 6+ mos PF IM (Fluarix Quad PF) (Completed)   Screening for cholesterol level       Labs drawn today. Await results.    Relevant Orders   Comprehensive metabolic panel   Lipid Panel w/o Chol/HDL Ratio   Routine screening for STI (sexually transmitted infection)       Labs drawn today. Await results.    Relevant Orders   Comprehensive metabolic panel   HIV antibody   UA/M w/rflx Culture, Routine   GC/Chlamydia Probe Amp   RPR   HSV(herpes simplex vrs) 1+2 ab-IgG   Hepatitis, Acute   Screening for diabetes mellitus       Labs drawn today. Await results.    Relevant Orders   Bayer DCA Hb A1c Waived   Comprehensive metabolic panel   Screening for deficiency anemia       Labs drawn today. Await results.    Relevant Orders   CBC with Differential/Platelet   Comprehensive metabolic panel       Follow up plan: Return if symptoms worsen or fail to improve.

## 2017-11-04 LAB — COMPREHENSIVE METABOLIC PANEL
ALBUMIN: 4.4 g/dL (ref 3.5–5.5)
ALK PHOS: 52 IU/L (ref 39–117)
ALT: 46 IU/L — AB (ref 0–32)
AST: 28 IU/L (ref 0–40)
Albumin/Globulin Ratio: 1.6 (ref 1.2–2.2)
BUN / CREAT RATIO: 12 (ref 9–23)
BUN: 9 mg/dL (ref 6–20)
Bilirubin Total: 0.4 mg/dL (ref 0.0–1.2)
CHLORIDE: 104 mmol/L (ref 96–106)
CO2: 23 mmol/L (ref 20–29)
CREATININE: 0.78 mg/dL (ref 0.57–1.00)
Calcium: 9.5 mg/dL (ref 8.7–10.2)
GFR calc Af Amer: 116 mL/min/{1.73_m2} (ref >59)
GFR calc non Af Amer: 100 mL/min/{1.73_m2} (ref >59)
GLUCOSE: 51 mg/dL — AB (ref 65–99)
Globulin, Total: 2.8 g/dL (ref 1.5–4.5)
Potassium: 4.4 mmol/L (ref 3.5–5.2)
Sodium: 141 mmol/L (ref 134–144)
Total Protein: 7.2 g/dL (ref 6.0–8.5)

## 2017-11-04 LAB — HSV(HERPES SIMPLEX VRS) I + II AB-IGG
HSV 1 GLYCOPROTEIN G AB, IGG: 29.4 {index} — AB (ref 0.00–0.90)
HSV 2 IgG, Type Spec: <0.91 index (ref 0.00–0.90)

## 2017-11-04 LAB — CBC WITH DIFFERENTIAL/PLATELET
BASOS ABS: 0 10*3/uL (ref 0.0–0.2)
Basos: 0 %
EOS (ABSOLUTE): 0.1 10*3/uL (ref 0.0–0.4)
Eos: 1 %
HEMOGLOBIN: 12.3 g/dL (ref 11.1–15.9)
Hematocrit: 37.6 % (ref 34.0–46.6)
IMMATURE GRANS (ABS): 0 10*3/uL (ref 0.0–0.1)
Immature Granulocytes: 0 %
LYMPHS: 43 %
Lymphocytes Absolute: 2.3 10*3/uL (ref 0.7–3.1)
MCH: 27.1 pg (ref 26.6–33.0)
MCHC: 32.7 g/dL (ref 31.5–35.7)
MCV: 83 fL (ref 79–97)
Monocytes Absolute: 0.4 10*3/uL (ref 0.1–0.9)
Monocytes: 6 %
Neutrophils Absolute: 2.7 10*3/uL (ref 1.4–7.0)
Neutrophils: 50 %
PLATELETS: 323 10*3/uL (ref 150–379)
RBC: 4.54 x10E6/uL (ref 3.77–5.28)
RDW: 14.9 % (ref 12.3–15.4)
WBC: 5.4 10*3/uL (ref 3.4–10.8)

## 2017-11-04 LAB — HIV ANTIBODY (ROUTINE TESTING W REFLEX): HIV Screen 4th Generation wRfx: NONREACTIVE

## 2017-11-04 LAB — LIPID PANEL W/O CHOL/HDL RATIO
CHOLESTEROL TOTAL: 176 mg/dL (ref 100–199)
HDL: 57 mg/dL (ref >39)
LDL Calculated: 109 mg/dL — ABNORMAL HIGH (ref 0–99)
Triglycerides: 51 mg/dL (ref 0–149)
VLDL CHOLESTEROL CAL: 10 mg/dL (ref 5–40)

## 2017-11-04 LAB — HEPATITIS PANEL, ACUTE
HEP A IGM: NEGATIVE
HEP B S AG: NEGATIVE
Hep B C IgM: NEGATIVE
Hep C Virus Ab: <0.1 s/co ratio (ref 0.0–0.9)

## 2017-11-04 LAB — RPR: RPR: NONREACTIVE

## 2017-11-04 LAB — TSH: TSH: 0.687 u[IU]/mL (ref 0.450–4.500)

## 2017-11-05 LAB — GC/CHLAMYDIA PROBE AMP
CHLAMYDIA, DNA PROBE: NEGATIVE
NEISSERIA GONORRHOEAE BY PCR: NEGATIVE

## 2018-04-07 ENCOUNTER — Telehealth: Payer: Self-pay | Admitting: Family Medicine

## 2018-04-07 NOTE — Telephone Encounter (Signed)
Please place orders

## 2018-04-07 NOTE — Telephone Encounter (Signed)
Patient plans to come in on Friday 7/5 to get the following vaccines... ° °MMR and Varicella  ° °Thank You °

## 2018-04-07 NOTE — Telephone Encounter (Signed)
ok 

## 2018-04-07 NOTE — Telephone Encounter (Signed)
Routing to provider  

## 2018-04-13 ENCOUNTER — Ambulatory Visit (INDEPENDENT_AMBULATORY_CARE_PROVIDER_SITE_OTHER): Payer: BLUE CROSS/BLUE SHIELD

## 2018-04-13 DIAGNOSIS — Z23 Encounter for immunization: Secondary | ICD-10-CM

## 2018-06-05 ENCOUNTER — Ambulatory Visit (INDEPENDENT_AMBULATORY_CARE_PROVIDER_SITE_OTHER): Payer: BLUE CROSS/BLUE SHIELD | Admitting: Family Medicine

## 2018-06-05 ENCOUNTER — Other Ambulatory Visit: Payer: Self-pay

## 2018-06-05 ENCOUNTER — Encounter: Payer: Self-pay | Admitting: Family Medicine

## 2018-06-05 ENCOUNTER — Encounter: Payer: BLUE CROSS/BLUE SHIELD | Admitting: Family Medicine

## 2018-06-05 VITALS — BP 96/67 | HR 64 | Temp 97.5°F | Ht 66.5 in | Wt 150.1 lb

## 2018-06-05 DIAGNOSIS — Z0000 Encounter for general adult medical examination without abnormal findings: Secondary | ICD-10-CM | POA: Diagnosis not present

## 2018-06-05 LAB — UA/M W/RFLX CULTURE, ROUTINE
BILIRUBIN UA: NEGATIVE
Glucose, UA: NEGATIVE
Ketones, UA: NEGATIVE
LEUKOCYTES UA: NEGATIVE
Nitrite, UA: NEGATIVE
PH UA: 5.5 (ref 5.0–7.5)
Protein, UA: NEGATIVE
RBC, UA: NEGATIVE
SPEC GRAV UA: 1.025 (ref 1.005–1.030)
Urobilinogen, Ur: 0.2 mg/dL (ref 0.2–1.0)

## 2018-06-05 LAB — MICROSCOPIC EXAMINATION

## 2018-06-05 MED ORDER — LEVONORGESTREL-ETHINYL ESTRAD 0.1-20 MG-MCG PO TABS: 1.0000 | ORAL_TABLET | Freq: Every day | ORAL | 13 refills | Status: DC

## 2018-06-05 NOTE — Patient Instructions (Addendum)
Health Maintenance, Female Adopting a healthy lifestyle and getting preventive care can go a long way to promote health and wellness. Talk with your health care provider about what schedule of regular examinations is right for you. This is a good chance for you to check in with your provider about disease prevention and staying healthy. In between checkups, there are plenty of things you can do on your own. Experts have done a lot of research about which lifestyle changes and preventive measures are most likely to keep you healthy. Ask your health care provider for more information. Weight and diet Eat a healthy diet  Be sure to include plenty of vegetables, fruits, low-fat dairy products, and lean protein.  Do not eat a lot of foods high in solid fats, added sugars, or salt.  Get regular exercise. This is one of the most important things you can do for your health. ? Most adults should exercise for at least 150 minutes each week. The exercise should increase your heart rate and make you sweat (moderate-intensity exercise). ? Most adults should also do strengthening exercises at least twice a week. This is in addition to the moderate-intensity exercise.  Maintain a healthy weight  Body mass index (BMI) is a measurement that can be used to identify possible weight problems. It estimates body fat based on height and weight. Your health care provider can help determine your BMI and help you achieve or maintain a healthy weight.  For females 20 years of age and older: ? A BMI below 18.5 is considered underweight. ? A BMI of 18.5 to 24.9 is normal. ? A BMI of 25 to 29.9 is considered overweight. ? A BMI of 30 and above is considered obese.  Watch levels of cholesterol and blood lipids  You should start having your blood tested for lipids and cholesterol at 34 years of age, then have this test every 5 years.  You may need to have your cholesterol levels checked more often if: ? Your lipid or  cholesterol levels are high. ? You are older than 34 years of age. ? You are at high risk for heart disease.  Cancer screening Lung Cancer  Lung cancer screening is recommended for adults 55-80 years old who are at high risk for lung cancer because of a history of smoking.  A yearly low-dose CT scan of the lungs is recommended for people who: ? Currently smoke. ? Have quit within the past 15 years. ? Have at least a 30-pack-year history of smoking. A pack year is smoking an average of one pack of cigarettes a day for 1 year.  Yearly screening should continue until it has been 15 years since you quit.  Yearly screening should stop if you develop a health problem that would prevent you from having lung cancer treatment.  Breast Cancer  Practice breast self-awareness. This means understanding how your breasts normally appear and feel.  It also means doing regular breast self-exams. Let your health care provider know about any changes, no matter how small.  If you are in your 20s or 30s, you should have a clinical breast exam (CBE) by a health care provider every 1-3 years as part of a regular health exam.  If you are 40 or older, have a CBE every year. Also consider having a breast X-ray (mammogram) every year.  If you have a family history of breast cancer, talk to your health care provider about genetic screening.  If you are at high risk   for breast cancer, talk to your health care provider about having an MRI and a mammogram every year.  Breast cancer gene (BRCA) assessment is recommended for women who have family members with BRCA-related cancers. BRCA-related cancers include: ? Breast. ? Ovarian. ? Tubal. ? Peritoneal cancers.  Results of the assessment will determine the need for genetic counseling and BRCA1 and BRCA2 testing.  Cervical Cancer Your health care provider may recommend that you be screened regularly for cancer of the pelvic organs (ovaries, uterus, and  vagina). This screening involves a pelvic examination, including checking for microscopic changes to the surface of your cervix (Pap test). You may be encouraged to have this screening done every 3 years, beginning at age 22.  For women ages 56-65, health care providers may recommend pelvic exams and Pap testing every 3 years, or they may recommend the Pap and pelvic exam, combined with testing for human papilloma virus (HPV), every 5 years. Some types of HPV increase your risk of cervical cancer. Testing for HPV may also be done on women of any age with unclear Pap test results.  Other health care providers may not recommend any screening for nonpregnant women who are considered low risk for pelvic cancer and who do not have symptoms. Ask your health care provider if a screening pelvic exam is right for you.  If you have had past treatment for cervical cancer or a condition that could lead to cancer, you need Pap tests and screening for cancer for at least 20 years after your treatment. If Pap tests have been discontinued, your risk factors (such as having a new sexual partner) need to be reassessed to determine if screening should resume. Some women have medical problems that increase the chance of getting cervical cancer. In these cases, your health care provider may recommend more frequent screening and Pap tests.  Colorectal Cancer  This type of cancer can be detected and often prevented.  Routine colorectal cancer screening usually begins at 34 years of age and continues through 34 years of age.  Your health care provider may recommend screening at an earlier age if you have risk factors for colon cancer.  Your health care provider may also recommend using home test kits to check for hidden blood in the stool.  A small camera at the end of a tube can be used to examine your colon directly (sigmoidoscopy or colonoscopy). This is done to check for the earliest forms of colorectal  cancer.  Routine screening usually begins at age 33.  Direct examination of the colon should be repeated every 5-10 years through 34 years of age. However, you may need to be screened more often if early forms of precancerous polyps or small growths are found.  Skin Cancer  Check your skin from head to toe regularly.  Tell your health care provider about any new moles or changes in moles, especially if there is a change in a mole's shape or color.  Also tell your health care provider if you have a mole that is larger than the size of a pencil eraser.  Always use sunscreen. Apply sunscreen liberally and repeatedly throughout the day.  Protect yourself by wearing long sleeves, pants, a wide-brimmed hat, and sunglasses whenever you are outside.  Heart disease, diabetes, and high blood pressure  High blood pressure causes heart disease and increases the risk of stroke. High blood pressure is more likely to develop in: ? People who have blood pressure in the high end of  the normal range (130-139/85-89 mm Hg). ? People who are overweight or obese. ? People who are African American.  If you are 21-29 years of age, have your blood pressure checked every 3-5 years. If you are 3 years of age or older, have your blood pressure checked every year. You should have your blood pressure measured twice-once when you are at a hospital or clinic, and once when you are not at a hospital or clinic. Record the average of the two measurements. To check your blood pressure when you are not at a hospital or clinic, you can use: ? An automated blood pressure machine at a pharmacy. ? A home blood pressure monitor.  If you are between 17 years and 37 years old, ask your health care provider if you should take aspirin to prevent strokes.  Have regular diabetes screenings. This involves taking a blood sample to check your fasting blood sugar level. ? If you are at a normal weight and have a low risk for diabetes,  have this test once every three years after 34 years of age. ? If you are overweight and have a high risk for diabetes, consider being tested at a younger age or more often. Preventing infection Hepatitis B  If you have a higher risk for hepatitis B, you should be screened for this virus. You are considered at high risk for hepatitis B if: ? You were born in a country where hepatitis B is common. Ask your health care provider which countries are considered high risk. ? Your parents were born in a high-risk country, and you have not been immunized against hepatitis B (hepatitis B vaccine). ? You have HIV or AIDS. ? You use needles to inject street drugs. ? You live with someone who has hepatitis B. ? You have had sex with someone who has hepatitis B. ? You get hemodialysis treatment. ? You take certain medicines for conditions, including cancer, organ transplantation, and autoimmune conditions.  Hepatitis C  Blood testing is recommended for: ? Everyone born from [birth year] through 1967. ? Anyone with known risk factors for hepatitis C.  Sexually transmitted infections (STIs)  You should be screened for sexually transmitted infections (STIs) including gonorrhea and chlamydia if: ? You are sexually active and are younger than 34 years of age. ? You are older than 34 years of age and your health care provider tells you that you are at risk for this type of infection. ? Your sexual activity has changed since you were last screened and you are at an increased risk for chlamydia or gonorrhea. Ask your health care provider if you are at risk.  If you do not have HIV, but are at risk, it may be recommended that you take a prescription medicine daily to prevent HIV infection. This is called pre-exposure prophylaxis (PrEP). You are considered at risk if: ? You are sexually active and do not regularly use condoms or know the HIV status of your partner(s). ? You take drugs by injection. ? You are  sexually active with a partner who has HIV.  Talk with your health care provider about whether you are at high risk of being infected with HIV. If you choose to begin PrEP, you should first be tested for HIV. You should then be tested every 3 months for as long as you are taking PrEP. Pregnancy  If you are premenopausal and you may become pregnant, ask your health care provider about preconception counseling.  If you may become  pregnant, take 400 to 800 micrograms (mcg) of folic acid every day.  If you want to prevent pregnancy, talk to your health care provider about birth control (contraception). Osteoporosis and menopause  Osteoporosis is a disease in which the bones lose minerals and strength with aging. This can result in serious bone fractures. Your risk for osteoporosis can be identified using a bone density scan.  If you are 39 years of age or older, or if you are at risk for osteoporosis and fractures, ask your health care provider if you should be screened.  Ask your health care provider whether you should take a calcium or vitamin D supplement to lower your risk for osteoporosis.  Menopause may have certain physical symptoms and risks.  Hormone replacement therapy may reduce some of these symptoms and risks. Talk to your health care provider about whether hormone replacement therapy is right for you. Follow these instructions at home:  Schedule regular health, dental, and eye exams.  Stay current with your immunizations.  Do not use any tobacco products including cigarettes, chewing tobacco, or electronic cigarettes.  If you are pregnant, do not drink alcohol.  If you are breastfeeding, limit how much and how often you drink alcohol.  Limit alcohol intake to no more than 1 drink per day for nonpregnant women. One drink equals 12 ounces of beer, 5 ounces of wine, or 1 ounces of hard liquor.  Do not use street drugs.  Do not share needles.  Ask your health care  provider for help if you need support or information about quitting drugs.  Tell your health care provider if you often feel depressed.  Tell your health care provider if you have ever been abused or do not feel safe at home. This information is not intended to replace advice given to you by your health care provider. Make sure you discuss any questions you have with your health care provider. Document Released: 04/08/2011 Document Revised: 02/29/2016 Document Reviewed: 06/27/2015 Elsevier Interactive Patient Education  2018 [place]  Hamstring Strain Rehab Ask your health care provider which exercises are safe for you. Do exercises exactly as told by your health care provider and adjust them as directed. It is normal to feel mild stretching, pulling, tightness, or discomfort as you do these exercises, but you should stop right away if you feel sudden pain or your pain gets worse.Do not begin these exercises until told by your health care provider. Strengthening exercises These exercises build strength and endurance in your thighs. Endurance is the ability to use your muscles for a long time, even after your muscles get tired. Exercise A: Straight leg raises ( hip extensors) 1. Lie on your belly on a firm surface. 2. Tense the muscles in your buttocks to lift your left / right leg about 4 inches (10 cm). Keep your knee straight. 3. If you cannot lift your leg this high without arching your back, place a pillow under your hips. 4. Hold the position for __________ seconds. 5. Slowly lower your leg to the starting position and allow it to relax completely before you start the next repetition. Repeat __________ times. Complete this exercise __________ times a day. Exercise B: Bridge ( hip extensors) 1. Lie on your back on a firm surface with your knees bent and your feet flat on the floor. 2. Tighten your buttocks muscles and lift your bottom off the floor until your trunk is level with  your thighs. ? You should feel the muscles working  in your buttocks and the back of your thighs. ? Do not arch your back. 3. Hold this position for __________ seconds. 4. Slowly lower your hips to the starting position. 5. Let your buttocks muscles relax completely between repetitions. Repeat __________ times. Complete this exercise __________ times a day. If told by your health care provider, keep your bottom lifted off the floor while you slowly walk your feet away from you as far as you can control. Hold for __________ seconds, then slowly walk your feet back toward you. Exercise C: Lateral walking with band ( hip abductors) 1. Stand in a long hallway. 2. Wrap a loop of exercise band around your legs, just above your knees. 3. Bend your knees gently and drop your hips down and back so your weight is over your heels. 4. Step to the side to move down the length of the hallway, keeping your toes pointed ahead of you and keeping tension in the band. 5. Repeat, leading with your other leg. Repeat __________ times. Complete this exercise __________ times a day. Exercise D: Single leg stand with reaching ( eccentric hamstring) 1. Stand on your left / right foot. Keep your big toe down on the floor and try to keep your arch lifted. 2. Slowly reach down toward the floor as far as you can while keeping your balance. 3. Hold this position for __________ seconds. Repeat __________ times. Complete this exercise __________ times a day. Exercise E: Prone plank ( abdominals and core) 1. Lie on your belly on the floor and prop yourself up on your elbows. Your hands should be straight out in front of you, and your elbows should be below your shoulders. Position your feet so the balls of your feet touch the ground. The ball of your foot is on the walking surface, right under your toes. 2. Tighten your abdominal muscles and lift your body off the floor. ? Do not arch your back. ? Do not hold your  breath. 3. Hold this position for __________ seconds. Repeat __________ times. Complete this exercise __________ times a day. This information is not intended to replace advice given to you by your health care provider. Make sure you discuss any questions you have with your health care provider. Document Released: 09/23/2005 Document Revised: 05/30/2016 Document Reviewed: 06/22/2015 [employer] Interactive Patient Education  [employer].

## 2018-06-05 NOTE — Progress Notes (Signed)
BP 96/67   Pulse 64   Temp (!) 97.5 F (36.4 C) (Oral)   Ht 5' 6.5" (1.689 m)   Wt 150 lb 1 oz (68.1 kg)   SpO2 99%   BMI 23.86 kg/m    Subjective:    Patient ID: Dawn Morgan, female    DOB: [birth year], 32 y.o.   MRN: [id number]  HPI: Dawn Morgan is a 32 y.o. female presenting on 06/05/2018 for comprehensive medical examination. Current medical complaints include:none  She currently lives with: husband and kids Menopausal Symptoms: no  Depression Screen done today and results listed below:  [facility] 06/05/2018 11/03/2017 11/03/2017 02/04/2017  Decreased Interest 0 0 0 0  Down, Depressed, Hopeless 0 0 0 0  PHQ - 2 Score 0 0 0 0  Altered sleeping 0 0 0 -  Tired, decreased energy 0 0 1 -  Change in appetite 0 0 0 -  Feeling bad or failure about yourself  0 0 0 -  Trouble concentrating 0 0 0 -  Moving slowly or fidgety/restless 0 0 0 -  Suicidal thoughts 0 0 0 -  PHQ-9 Score 0 0 1 -  Difficult doing work/chores Not difficult at all Not difficult at all - -    Past Medical History:  Past Medical History:  Diagnosis Date  . Choledocholithiasis   . Elevated LFTs   . GERD (gastroesophageal reflux disease)   . Right upper quadrant abdominal pain   . Sickle cell trait [facility](HCC)     Surgical History:  Past Surgical History:  Procedure Laterality Date  . CHOLECYSTECTOMY N/A 03/05/2016   Procedure: LAPAROSCOPIC CHOLECYSTECTOMY WITH INTRAOPERATIVE CHOLANGIOGRAM;  Surgeon: Rodman PickleLuke Aaron Kinsinger, MD;  [facility];  Service: General;  Laterality: N/A;  . ERCP N/A 03/06/2016   Procedure: ENDOSCOPIC RETROGRADE CHOLANGIOPANCREATOGRAPHY (ERCP);  Surgeon: Meryl DareMalcolm T Stark, MD;  [facility];  Service: Endoscopy;  Laterality: N/A;  . NO PAST SURGERIES      Medications:  Current Outpatient Medications on File Prior to Visit  Medication Sig  . acetaminophen (TYLENOL) 325 MG tablet Take 650 mg by mouth every 6 (six) hours as needed.   No current  facility-administered medications on file prior to visit.     Allergies:  No Known Allergies  Social History:  Social History   Socioeconomic History  . Marital status: Married    Spouse name: Simeon CraftWellman  . Number of children: 3  . Years of education: Not on file  . Highest education level: Not on file  Occupational History  . Occupation: Education officer, [employer]  Social Needs  . Financial resource strain: Not on file  . Food insecurity:    Worry: Not on file    Inability: Not on file  . Transportation needs:    Medical: Not on file    Non-medical: Not on file  Tobacco Use  . Smoking status: Never Smoker  . Smokeless tobacco: Never Used  Substance and Sexual Activity  . Alcohol use: Yes    Comment: OCCASIONAL  . Drug use: No  . Sexual activity: Yes    Birth control/protection: Pill  Lifestyle  . Physical activity:    Days per week: Not on file    Minutes per session: Not on file  . Stress: Not on file  Relationships  . Social connections:    Talks on phone: Not on file    Gets together: Not on file    Attends religious service: Not on file  Active member of club or organization: Not on file    Attends meetings of clubs or organizations: Not on file    Relationship status: Not on file  . Intimate partner violence:    Fear of current or ex partner: Not on file    Emotionally abused: Not on file    Physically abused: Not on file    Forced sexual activity: Not on file  Other Topics Concern  . Not on file  Social History Narrative  . Not on file   Social History   Tobacco Use  Smoking Status Never Smoker  Smokeless Tobacco Never Used   Social History   Substance and Sexual Activity  Alcohol Use Yes   Comment: OCCASIONAL    Family History:  Family History  Problem Relation Age of Onset  . Diabetes Father   . Arthritis Maternal Grandmother   . Colon cancer Neg Hx     Past medical history, surgical history, medications, allergies, family history and social history  reviewed with patient today and changes made to appropriate areas of the chart.   Review of Systems  Constitutional: Negative.   HENT: Negative.   Eyes: Negative.   Respiratory: Negative.   Cardiovascular: Positive for palpitations. Negative for chest pain, orthopnea, claudication, leg swelling and PND.  Gastrointestinal: Negative for abdominal pain, blood in stool, constipation, diarrhea, heartburn, melena, nausea and vomiting.  Genitourinary: Negative.   Musculoskeletal: Positive for joint pain (L knee pain). Negative for back pain, falls, myalgias and neck pain.  Skin: Negative.   Neurological: Negative.   Endo/Heme/Allergies: Negative for environmental allergies and polydipsia. Does not bruise/bleed easily.  Psychiatric/Behavioral: Negative.     All other ROS negative except what is listed above and in the HPI.      Objective:    BP 96/67   Pulse 64   Temp (!) 97.5 F (36.4 C) (Oral)   Ht 5' 6.5" (1.689 m)   Wt 150 lb 1 oz (68.1 kg)   SpO2 99%   BMI 23.86 kg/m   Wt Readings from Last 3 Encounters:  06/05/18 150 lb 1 oz (68.1 kg)  11/03/17 151 lb 8 oz (68.7 kg)  06/02/17 152 lb (68.9 kg)    Physical Exam  Constitutional: She is oriented to person, place, and time. She appears well-developed and well-nourished. No distress.  HENT:  Head: Normocephalic and atraumatic.  Right Ear: Hearing, tympanic membrane, external ear and ear canal normal.  Left Ear: Hearing, tympanic membrane, external ear and ear canal normal.  Nose: Nose normal.  Mouth/Throat: Uvula is midline, oropharynx is clear and moist and mucous membranes are normal. No oropharyngeal exudate.  Eyes: Pupils are equal, round, and reactive to light. Conjunctivae, EOM and lids are normal. Right eye exhibits no discharge. Left eye exhibits no discharge. No scleral icterus.  Neck: Normal range of motion. Neck supple. No JVD present. No tracheal deviation present. No thyromegaly present.  Cardiovascular: Normal  rate, regular rhythm, normal heart sounds and intact distal pulses. Exam reveals no gallop and no friction rub.  No murmur heard. Pulmonary/Chest: Effort normal and breath sounds normal. No stridor. No respiratory distress. She has no wheezes. She has no rales. She exhibits no tenderness.  Abdominal: Soft. Bowel sounds are normal. She exhibits no distension and no mass. There is no tenderness. There is no rebound and no guarding. No hernia.  Genitourinary:  Genitourinary Comments: Breast and pelvic exams deferred with shared decision making  Musculoskeletal: Normal range of motion. She exhibits  no edema, tenderness or deformity.  Lymphadenopathy:    She has no cervical adenopathy.  Neurological: She is alert and oriented to person, place, and time. She displays normal reflexes. No cranial nerve deficit or sensory deficit. She exhibits normal muscle tone. Coordination normal.  Skin: Skin is warm, dry and intact. Capillary refill takes less than 2 seconds. No rash noted. She is not diaphoretic. No erythema. No pallor.  Psychiatric: She has a normal mood and affect. Her speech is normal and behavior is normal. Judgment and thought content normal. Cognition and memory are normal.  Nursing note and vitals reviewed.   Results for orders placed or performed in visit on 11/03/17  GC/Chlamydia Probe Amp  Result Value Ref Range   Chlamydia trachomatis, NAA Negative Negative   Neisseria gonorrhoeae by PCR Negative Negative  CBC with Differential/Platelet  Result Value Ref Range   WBC 5.4 3.4 - 10.8 x10E3/uL   RBC 4.54 3.77 - 5.28 x10E6/uL   Hemoglobin 12.3 11.1 - 15.9 g/dL   Hematocrit 91.4 78.2 - 46.6 %   MCV 83 79 - 97 fL   MCH 27.1 26.6 - 33.0 pg   MCHC 32.7 31.5 - 35.7 g/dL   RDW 95.6 21.3 - 08.6 %   Platelets 323 150 - 379 x10E3/uL   Neutrophils 50 Not Estab. %   Lymphs 43 Not Estab. %   Monocytes 6 Not Estab. %   Eos 1 Not Estab. %   Basos 0 Not Estab. %   Neutrophils Absolute 2.7 1.4  - 7.0 x10E3/uL   Lymphocytes Absolute 2.3 0.7 - 3.1 x10E3/uL   Monocytes Absolute 0.4 0.1 - 0.9 x10E3/uL   EOS (ABSOLUTE) 0.1 0.0 - 0.4 x10E3/uL   Basophils Absolute 0.0 0.0 - 0.2 x10E3/uL   Immature Granulocytes 0 Not Estab. %   Immature Grans (Abs) 0.0 0.0 - 0.1 x10E3/uL  Bayer DCA Hb A1c Waived  Result Value Ref Range   HB A1C (BAYER DCA - WAIVED) 5.2 <7.0 %  Comprehensive metabolic panel  Result Value Ref Range   Glucose 51 (L) 65 - 99 mg/dL   BUN 9 6 - 20 mg/dL   Creatinine, Ser 5.78 0.57 - 1.00 mg/dL   GFR calc non Af Amer 100 >59 mL/min/1.73   GFR calc Af Amer 116 >59 mL/min/1.73   BUN/Creatinine Ratio 12 9 - 23   Sodium 141 134 - 144 mmol/L   Potassium 4.4 3.5 - 5.2 mmol/L   Chloride 104 96 - 106 mmol/L   CO2 23 20 - 29 mmol/L   Calcium 9.5 8.7 - 10.2 mg/dL   Total Protein 7.2 6.0 - 8.5 g/dL   Albumin 4.4 3.5 - 5.5 g/dL   Globulin, Total 2.8 1.5 - 4.5 g/dL   Albumin/Globulin Ratio 1.6 1.2 - 2.2   Bilirubin Total 0.4 0.0 - 1.2 mg/dL   Alkaline Phosphatase 52 39 - 117 IU/L   AST 28 0 - 40 IU/L   ALT 46 (H) 0 - 32 IU/L  HIV antibody  Result Value Ref Range   HIV Screen 4th Generation wRfx Non Reactive Non Reactive  Lipid Panel w/o Chol/HDL Ratio  Result Value Ref Range   Cholesterol, Total 176 100 - 199 mg/dL   Triglycerides 51 0 - 149 mg/dL   HDL 57 >46 mg/dL   VLDL Cholesterol Cal 10 5 - 40 mg/dL   LDL Calculated 962 (H) 0 - 99 mg/dL  TSH  Result Value Ref Range   TSH 0.687 0.450 - 4.500  uIU/mL  UA/M w/rflx Culture, Routine  Result Value Ref Range   Specific Gravity, UA 1.015 1.005 - 1.030   pH, UA 7.0 5.0 - 7.5   Color, UA Yellow Yellow   Appearance Ur Clear Clear   Leukocytes, UA Negative Negative   Protein, UA Negative Negative/Trace   Glucose, UA Negative Negative   Ketones, UA Negative Negative   RBC, UA Negative Negative   Bilirubin, UA Negative Negative   Urobilinogen, Ur 0.2 0.2 - 1.0 mg/dL   Nitrite, UA Negative Negative  RPR  Result Value  Ref Range   RPR Ser Ql Non Reactive Non Reactive  HSV(herpes simplex vrs) 1+2 ab-IgG  Result Value Ref Range   HSV 1 Glycoprotein G Ab, IgG 29.40 (H) 0.00 - 0.90 index   HSV 2 IgG, Type Spec <0.91 0.00 - 0.90 index  Hepatitis, Acute  Result Value Ref Range   Hep A IgM Negative Negative   Hepatitis B Surface Ag Negative Negative   Hep B C IgM Negative Negative   Hep C Virus Ab <0.1 0.0 - 0.9 s/co ratio      Assessment & Plan:   Problem List Items Addressed This Visit    None    Visit Diagnoses    Routine general medical examination at a health care facility    -  Primary   Vaccines up to date. Screening labs checked today. Pap up to date. Continue diet and exercise. Call with any concerns.    Relevant Orders   CBC with Differential/Platelet   Comprehensive metabolic panel   Lipid Panel w/o Chol/HDL Ratio   TSH   UA/M w/rflx Culture, Routine       Follow up plan: Return in about 1 year (around 06/06/2019) for Physical.   LABORATORY TESTING:  - Pap smear: up to date  IMMUNIZATIONS:   - Tdap: Tetanus vaccination status reviewed: last tetanus booster within 10 years. - Influenza: Postponed to flu season  PATIENT COUNSELING:   Advised to take 1 mg of folate supplement per day if capable of pregnancy.   Sexuality: Discussed sexually transmitted diseases, partner selection, use of condoms, avoidance of unintended pregnancy  and contraceptive alternatives.   Advised to avoid cigarette smoking.  I discussed with the patient that most people either abstain from alcohol or drink within safe limits (<=14/week and <=4 drinks/occasion for males, <=7/weeks and <= 3 drinks/occasion for females) and that the risk for alcohol disorders and other health effects rises proportionally with the number of drinks per week and how often a drinker exceeds daily limits.  Discussed cessation/primary prevention of drug use and availability of treatment for abuse.   Diet: Encouraged to adjust  caloric intake to maintain  or achieve ideal body weight, to reduce intake of dietary saturated fat and total fat, to limit sodium intake by avoiding high sodium foods and not adding table salt, and to maintain adequate dietary potassium and calcium preferably from fresh fruits, vegetables, and low-fat dairy products.    stressed the importance of regular exercise  Injury prevention: Discussed safety belts, safety helmets, smoke detector, smoking near bedding or upholstery.   Dental health: Discussed importance of regular tooth brushing, flossing, and dental visits.    NEXT PREVENTATIVE PHYSICAL DUE IN 1 YEAR. Return in about 1 year (around 06/06/2019) for Physical.

## 2018-06-06 LAB — CBC WITH DIFFERENTIAL/PLATELET
Basophils Absolute: 0 10*3/uL (ref 0.0–0.2)
Basos: 1 %
EOS (ABSOLUTE): 0 10*3/uL (ref 0.0–0.4)
EOS: 1 %
HEMATOCRIT: 39.3 % (ref 34.0–46.6)
Hemoglobin: 12.7 g/dL (ref 11.1–15.9)
Immature Grans (Abs): 0 10*3/uL (ref 0.0–0.1)
Immature Granulocytes: 0 %
LYMPHS ABS: 1.7 10*3/uL (ref 0.7–3.1)
Lymphs: 44 %
MCH: 27.3 pg (ref 26.6–33.0)
MCHC: 32.3 g/dL (ref 31.5–35.7)
MCV: 84 fL (ref 79–97)
Monocytes Absolute: 0.3 10*3/uL (ref 0.1–0.9)
Monocytes: 7 %
Neutrophils Absolute: 1.8 10*3/uL (ref 1.4–7.0)
Neutrophils: 47 %
PLATELETS: 290 10*3/uL (ref 150–450)
RBC: 4.66 x10E6/uL (ref 3.77–5.28)
RDW: 13.9 % (ref 12.3–15.4)
WBC: 3.9 10*3/uL (ref 3.4–10.8)

## 2018-06-06 LAB — LIPID PANEL W/O CHOL/HDL RATIO
Cholesterol, Total: 174 mg/dL (ref 100–199)
HDL: 48 mg/dL (ref >39)
LDL Calculated: 113 mg/dL — ABNORMAL HIGH (ref 0–99)
Triglycerides: 66 mg/dL (ref 0–149)
VLDL CHOLESTEROL CAL: 13 mg/dL (ref 5–40)

## 2018-06-06 LAB — COMPREHENSIVE METABOLIC PANEL
A/G RATIO: 1.5 (ref 1.2–2.2)
ALT: 29 IU/L (ref 0–32)
AST: 19 IU/L (ref 0–40)
Albumin: 4.1 g/dL (ref 3.5–5.5)
Alkaline Phosphatase: 48 IU/L (ref 39–117)
BUN/Creatinine Ratio: 11 (ref 9–23)
BUN: 10 mg/dL (ref 6–20)
Bilirubin Total: 0.6 mg/dL (ref 0.0–1.2)
CO2: 22 mmol/L (ref 20–29)
Calcium: 9.2 mg/dL (ref 8.7–10.2)
Chloride: 107 mmol/L — ABNORMAL HIGH (ref 96–106)
Creatinine, Ser: 0.95 mg/dL (ref 0.57–1.00)
GFR calc Af Amer: 90 mL/min/{1.73_m2} (ref >59)
GFR calc non Af Amer: 78 mL/min/{1.73_m2} (ref >59)
GLOBULIN, TOTAL: 2.8 g/dL (ref 1.5–4.5)
Glucose: 91 mg/dL (ref 65–99)
Potassium: 4.1 mmol/L (ref 3.5–5.2)
SODIUM: 141 mmol/L (ref 134–144)
Total Protein: 6.9 g/dL (ref 6.0–8.5)

## 2018-06-06 LAB — TSH: TSH: 0.477 u[IU]/mL (ref 0.450–4.500)

## 2018-07-31 ENCOUNTER — Ambulatory Visit (INDEPENDENT_AMBULATORY_CARE_PROVIDER_SITE_OTHER): Payer: BLUE CROSS/BLUE SHIELD

## 2018-07-31 DIAGNOSIS — Z23 Encounter for immunization: Secondary | ICD-10-CM | POA: Diagnosis not present

## 2018-07-31 NOTE — Patient Instructions (Signed)

## 2018-08-31 ENCOUNTER — Telehealth: Payer: Self-pay | Admitting: Family Medicine

## 2018-08-31 MED ORDER — OSELTAMIVIR PHOSPHATE 75 MG PO CAPS: 75.0000 mg | ORAL_CAPSULE | Freq: Every day | ORAL | 0 refills | Status: DC

## 2018-08-31 NOTE — Telephone Encounter (Signed)
Son tested + for influenza A- will treat prophylactically

## 2019-05-12 ENCOUNTER — Other Ambulatory Visit: Payer: Self-pay | Admitting: Family Medicine

## 2019-06-11 ENCOUNTER — Encounter: Payer: Self-pay | Admitting: Family Medicine

## 2019-06-11 ENCOUNTER — Ambulatory Visit (INDEPENDENT_AMBULATORY_CARE_PROVIDER_SITE_OTHER): Payer: BC Managed Care – PPO | Admitting: Family Medicine

## 2019-06-11 ENCOUNTER — Other Ambulatory Visit: Payer: Self-pay

## 2019-06-11 VITALS — BP 108/78 | HR 76 | Temp 98.6°F | Ht 65.47 in | Wt 156.2 lb

## 2019-06-11 DIAGNOSIS — Z0000 Encounter for general adult medical examination without abnormal findings: Secondary | ICD-10-CM | POA: Diagnosis not present

## 2019-06-11 DIAGNOSIS — Z23 Encounter for immunization: Secondary | ICD-10-CM | POA: Diagnosis not present

## 2019-06-11 DIAGNOSIS — R61 Generalized hyperhidrosis: Secondary | ICD-10-CM

## 2019-06-11 LAB — UA/M W/RFLX CULTURE, ROUTINE
Bilirubin, UA: NEGATIVE
Glucose, UA: NEGATIVE
Ketones, UA: NEGATIVE
Leukocytes,UA: NEGATIVE
Nitrite, UA: NEGATIVE
Protein,UA: NEGATIVE
RBC, UA: NEGATIVE
Specific Gravity, UA: 1.02 (ref 1.005–1.030)
Urobilinogen, Ur: 0.2 mg/dL (ref 0.2–1.0)
pH, UA: 7 (ref 5.0–7.5)

## 2019-06-11 NOTE — Progress Notes (Signed)
BP 108/78   Pulse 76   Temp 98.6 F (37 C)   Ht 5' 5.47" (1.663 m)   Wt 156 lb 4 oz (70.9 kg)   SpO2 97%   BMI 25.63 kg/m    Subjective:    Patient ID: Dawn Morgan, female    DOB: 1985/11/02, 33 y.o.   MRN: [id number]  HPI: Dawn Morgan is a 33 y.o. female presenting on 06/11/2019 for comprehensive medical examination. Current medical complaints include:see below  About 4 months now of night sweats. Has not changed sleepwear, climate inside house, medications, routine. No recent travel, illnesses, fevers, rashes, weight changes, cough, CP, SOB.   She currently lives with: Menopausal Symptoms: no  Depression Screen done today and results listed below:  Depression screen [facility] 2/9 06/11/2019 06/05/2018 11/03/2017 11/03/2017 02/04/2017  Decreased Interest 0 0 0 0 0  Down, Depressed, Hopeless 0 0 0 0 0  PHQ - 2 Score 0 0 0 0 0  Altered sleeping 0 0 0 0 -  Tired, decreased energy 0 0 0 1 -  Change in appetite 0 0 0 0 -  Feeling bad or failure about yourself  0 0 0 0 -  Trouble concentrating 0 0 0 0 -  Moving slowly or fidgety/restless 0 0 0 0 -  Suicidal thoughts 0 0 0 0 -  PHQ-9 Score 0 0 0 1 -  Difficult doing work/chores - Not difficult at all Not difficult at all - -    The patient does not have a history of falls. I did complete a risk assessment for falls. A plan of care for falls was documented.   Past Medical History:  Past Medical History:  Diagnosis Date  . Choledocholithiasis   . Elevated LFTs   . GERD (gastroesophageal reflux disease)   . Right upper quadrant abdominal pain   . Sickle cell trait [facility])     Surgical History:  Past Surgical History:  Procedure Laterality Date  . CHOLECYSTECTOMY N/A 03/05/2016   Procedure: LAPAROSCOPIC CHOLECYSTECTOMY WITH INTRAOPERATIVE CHOLANGIOGRAM;  Surgeon: Mickeal Skinner, MD;  Location: [facility];  Service: General;  Laterality: N/A;  . ERCP N/A 03/06/2016   Procedure: ENDOSCOPIC RETROGRADE CHOLANGIOPANCREATOGRAPHY (ERCP);   Surgeon: Ladene Artist, MD;  Location: [facility] END[facility]COPY;  Service: Endoscopy;  Laterality: N/A;  . NO PAST SURGERIES      Medications:  Current Outpatient Medications on File Prior to Visit  Medication Sig  . acetaminophen (TYLENOL) 325 MG tablet Take 650 mg by mouth every 6 (six) hours as needed.  . SRONYX 0.1-20 MG-MCG tablet TAKE 1 TABLET BY MOUTH EVERY DAY   No current facility-administered medications on file prior to visit.     Allergies:  No Known Allergies  Social History:  Social History   Socioeconomic History  . Marital status: Married    Spouse name: Okey Dupre  . Number of children: 3  . Years of education: Not on file  . Highest education level: Not on file  Occupational History  . Occupation: Theme park manager  Social Needs  . Financial resource strain: Not on file  . Food insecurity    Worry: Not on file    Inability: Not on file  . Transportation needs    Medical: Not on file    Non-medical: Not on file  Tobacco Use  . Smoking status: Never Smoker  . Smokeless tobacco: Never Used  Substance and Sexual Activity  . Alcohol use: Yes    Comment: OCCASIONAL  . Drug  use: No  . Sexual activity: Yes    Birth control/protection: Pill  Lifestyle  . Physical activity    Days per week: Not on file    Minutes per session: Not on file  . Stress: Not on file  Relationships  . Social Musicianconnections    Talks on phone: Not on file    Gets together: Not on file    Attends religious service: Not on file    Active member of club or organization: Not on file    Attends meetings of clubs or organizations: Not on file    Relationship status: Not on file  . Intimate partner violence    Fear of current or ex partner: Not on file    Emotionally abused: Not on file    Physically abused: Not on file    Forced sexual activity: Not on file  Other Topics Concern  . Not on file  Social History Narrative  . Not on file   Social History   Tobacco Use  Smoking Status Never Smoker   Smokeless Tobacco Never Used   Social History   Substance and Sexual Activity  Alcohol Use Yes   Comment: OCCASIONAL    Family History:  Family History  Problem Relation Age of Onset  . Diabetes Father   . Arthritis Maternal Grandmother   . Colon cancer Neg Hx     Past medical history, surgical history, medications, allergies, family history and social history reviewed with patient today and changes made to appropriate areas of the chart.   Review of Systems - General ROS: positive for  - night sweats Psychological ROS: negative Ophthalmic ROS: negative ENT ROS: negative Allergy and Immunology ROS: negative Hematological and Lymphatic ROS: negative Endocrine ROS: negative Breast ROS: negative for breast lumps Respiratory ROS: no cough, shortness of breath, or wheezing Cardiovascular ROS: no chest pain or dyspnea on exertion Gastrointestinal ROS: no abdominal pain, change in bowel habits, or black or bloody stools Genito-Urinary ROS: no dysuria, trouble voiding, or hematuria Musculoskeletal ROS: negative Neurological ROS: no TIA or stroke symptoms Dermatological ROS: negative All other ROS negative except what is listed above and in the HPI.      Objective:    BP 108/78   Pulse 76   Temp 98.6 F (37 C)   Ht 5' 5.47" (1.663 m)   Wt 156 lb 4 oz (70.9 kg)   SpO2 97%   BMI 25.63 kg/m   Wt Readings from Last 3 Encounters:  06/11/19 156 lb 4 oz (70.9 kg)  06/05/18 150 lb 1 oz (68.1 kg)  11/03/17 151 lb 8 oz (68.7 kg)    Physical Exam Vitals signs and nursing note reviewed.  Constitutional:      General: She is not in acute distress.    Appearance: She is well-developed.  HENT:     Head: Atraumatic.     Right Ear: External ear normal.     Left Ear: External ear normal.     Nose: Nose normal.     Mouth/Throat:     Pharynx: No oropharyngeal exudate.  Eyes:     General: No scleral icterus.    Conjunctiva/sclera: Conjunctivae normal.     Pupils: Pupils are  equal, round, and reactive to light.  Neck:     Musculoskeletal: Normal range of motion and neck supple.     Thyroid: No thyromegaly.  Cardiovascular:     Rate and Rhythm: Normal rate and regular rhythm.     Heart sounds:  Normal heart sounds.  Pulmonary:     Effort: Pulmonary effort is normal. No respiratory distress.     Breath sounds: Normal breath sounds.  Chest:     Breasts:        Right: No mass, skin change or tenderness.        Left: No mass, skin change or tenderness.  Abdominal:     General: Bowel sounds are normal.     Palpations: Abdomen is soft. There is no mass.     Tenderness: There is no abdominal tenderness.  Genitourinary:    Comments: Declines GU exam Musculoskeletal: Normal range of motion.        General: No tenderness.  Lymphadenopathy:     Cervical: No cervical adenopathy.     Upper Body:     Right upper body: No axillary adenopathy.     Left upper body: No axillary adenopathy.  Skin:    General: Skin is warm and dry.     Findings: No rash.  Neurological:     Mental Status: She is alert and oriented to person, place, and time.     Cranial Nerves: No cranial nerve deficit.  Psychiatric:        Behavior: Behavior normal.     Results for orders placed or performed in visit on 06/05/18  Microscopic Examination   URINE  Result Value Ref Range   WBC, UA 0-5 0 - 5 /hpf   RBC, UA 0-2 0 - 2 /hpf   Epithelial Cells (non renal) 0-10 0 - 10 /hpf   Bacteria, UA Moderate (A) None seen/Few  CBC with Differential/Platelet  Result Value Ref Range   WBC 3.9 3.4 - 10.8 x10E3/uL   RBC 4.66 3.77 - 5.28 x10E6/uL   Hemoglobin 12.7 11.1 - 15.9 g/dL   Hematocrit 16.1 09.6 - 46.6 %   MCV 84 79 - 97 fL   MCH 27.3 26.6 - 33.0 pg   MCHC 32.3 31.5 - 35.7 g/dL   RDW 04.5 40.9 - 81.1 %   Platelets 290 150 - 450 x10E3/uL   Neutrophils 47 Not Estab. %   Lymphs 44 Not Estab. %   Monocytes 7 Not Estab. %   Eos 1 Not Estab. %   Basos 1 Not Estab. %   Neutrophils  Absolute 1.8 1.4 - 7.0 x10E3/uL   Lymphocytes Absolute 1.7 0.7 - 3.1 x10E3/uL   Monocytes Absolute 0.3 0.1 - 0.9 x10E3/uL   EOS (ABSOLUTE) 0.0 0.0 - 0.4 x10E3/uL   Basophils Absolute 0.0 0.0 - 0.2 x10E3/uL   Immature Granulocytes 0 Not Estab. %   Immature Grans (Abs) 0.0 0.0 - 0.1 x10E3/uL  Comprehensive metabolic panel  Result Value Ref Range   Glucose 91 65 - 99 mg/dL   BUN 10 6 - 20 mg/dL   Creatinine, Ser 9.14 0.57 - 1.00 mg/dL   GFR calc non Af Amer 78 >59 mL/min/1.73   GFR calc Af Amer 90 >59 mL/min/1.73   BUN/Creatinine Ratio 11 9 - 23   Sodium 141 134 - 144 mmol/L   Potassium 4.1 3.5 - 5.2 mmol/L   Chloride 107 (H) 96 - 106 mmol/L   CO2 22 20 - 29 mmol/L   Calcium 9.2 8.7 - 10.2 mg/dL   Total Protein 6.9 6.0 - 8.5 g/dL   Albumin 4.1 3.5 - 5.5 g/dL   Globulin, Total 2.8 1.5 - 4.5 g/dL   Albumin/Globulin Ratio 1.5 1.2 - 2.2   Bilirubin Total 0.6 0.0 - 1.2 mg/dL  Alkaline Phosphatase 48 39 - 117 IU/L   AST 19 0 - 40 IU/L   ALT 29 0 - 32 IU/L  Lipid Panel w/o Chol/HDL Ratio  Result Value Ref Range   Cholesterol, Total 174 100 - 199 mg/dL   Triglycerides 66 0 - 149 mg/dL   HDL 48 >71 mg/dL   VLDL Cholesterol Cal 13 5 - 40 mg/dL   LDL Calculated 696 (H) 0 - 99 mg/dL  TSH  Result Value Ref Range   TSH 0.477 0.450 - 4.500 uIU/mL  UA/M w/rflx Culture, Routine   Specimen: Urine   URINE  Result Value Ref Range   Specific Gravity, UA 1.025 1.005 - 1.030   pH, UA 5.5 5.0 - 7.5   Color, UA Yellow Yellow   Appearance Ur Clear Clear   Leukocytes, UA Negative Negative   Protein, UA Negative Negative/Trace   Glucose, UA Negative Negative   Ketones, UA Negative Negative   RBC, UA Negative Negative   Bilirubin, UA Negative Negative   Urobilinogen, Ur 0.2 0.2 - 1.0 mg/dL   Nitrite, UA Negative Negative   Microscopic Examination See below:       Assessment & Plan:   Problem List Items Addressed This Visit    None    Visit Diagnoses    Routine general medical  examination at a health care facility    -  Primary   Relevant Orders   CBC with Differential/Platelet   Comprehensive metabolic panel   Lipid Panel w/o Chol/HDL Ratio   TSH   UA/M w/rflx Culture, Routine   CBC with Differential/Platelet   Comprehensive metabolic panel   Lipid Panel w/o Chol/HDL Ratio   TSH   UA/M w/rflx Culture, Routine   Immunization due       Relevant Orders   Flu Vaccine QUAD 6+ mos PF IM (Fluarix Quad PF) (Completed)   Night sweats       Will obtain basic labs and monitor closely for persistence or new sxs.        Follow up plan: Return in about 1 year (around 06/10/2020) for CPE.   LABORATORY TESTING:  - Pap smear: up to date  IMMUNIZATIONS:   - Tdap: Tetanus vaccination status reviewed: last tetanus booster within 10 years. - Influenza: Administered today  PATIENT COUNSELING:   Advised to take 1 mg of folate supplement per day if capable of pregnancy.   Sexuality: Discussed sexually transmitted diseases, partner selection, use of condoms, avoidance of unintended pregnancy  and contraceptive alternatives.   Advised to avoid cigarette smoking.  I discussed with the patient that most people either abstain from alcohol or drink within safe limits (<=14/week and <=4 drinks/occasion for males, <=7/weeks and <= 3 drinks/occasion for females) and that the risk for alcohol disorders and other health effects rises proportionally with the number of drinks per week and how often a drinker exceeds daily limits.  Discussed cessation/primary prevention of drug use and availability of treatment for abuse.   Diet: Encouraged to adjust caloric intake to maintain  or achieve ideal body weight, to reduce intake of dietary saturated fat and total fat, to limit sodium intake by avoiding high sodium foods and not adding table salt, and to maintain adequate dietary potassium and calcium preferably from fresh fruits, vegetables, and low-fat dairy products.    stressed the  importance of regular exercise  Injury prevention: Discussed safety belts, safety helmets, smoke detector, smoking near bedding or upholstery.   Dental health: Discussed importance of  regular tooth brushing, flossing, and dental visits.    NEXT PREVENTATIVE PHYSICAL DUE IN 1 YEAR. Return in about 1 year (around 06/10/2020) for CPE.

## 2019-06-12 LAB — CBC WITH DIFFERENTIAL/PLATELET
Basophils Absolute: 0 10*3/uL (ref 0.0–0.2)
Basos: 1 %
EOS (ABSOLUTE): 0 10*3/uL (ref 0.0–0.4)
Eos: 1 %
Hematocrit: 40.6 % (ref 34.0–46.6)
Hemoglobin: 13.1 g/dL (ref 11.1–15.9)
Immature Grans (Abs): 0 10*3/uL (ref 0.0–0.1)
Immature Granulocytes: 0 %
Lymphocytes Absolute: 1.5 10*3/uL (ref 0.7–3.1)
Lymphs: 45 %
MCH: 27.3 pg (ref 26.6–33.0)
MCHC: 32.3 g/dL (ref 31.5–35.7)
MCV: 85 fL (ref 79–97)
Monocytes Absolute: 0.3 10*3/uL (ref 0.1–0.9)
Monocytes: 10 %
Neutrophils Absolute: 1.4 10*3/uL (ref 1.4–7.0)
Neutrophils: 43 %
Platelets: 266 10*3/uL (ref 150–450)
RBC: 4.8 x10E6/uL (ref 3.77–5.28)
RDW: 13.1 % (ref 11.7–15.4)
WBC: 3.2 10*3/uL — ABNORMAL LOW (ref 3.4–10.8)

## 2019-06-12 LAB — COMPREHENSIVE METABOLIC PANEL
ALT: 33 IU/L — ABNORMAL HIGH (ref 0–32)
AST: 23 IU/L (ref 0–40)
Albumin/Globulin Ratio: 1.8 (ref 1.2–2.2)
Albumin: 4.6 g/dL (ref 3.8–4.8)
Alkaline Phosphatase: 51 IU/L (ref 39–117)
BUN/Creatinine Ratio: 13 (ref 9–23)
BUN: 11 mg/dL (ref 6–20)
Bilirubin Total: 0.8 mg/dL (ref 0.0–1.2)
CO2: 23 mmol/L (ref 20–29)
Calcium: 9.3 mg/dL (ref 8.7–10.2)
Chloride: 104 mmol/L (ref 96–106)
Creatinine, Ser: 0.87 mg/dL (ref 0.57–1.00)
GFR calc Af Amer: 100 mL/min/{1.73_m2} (ref >59)
GFR calc non Af Amer: 87 mL/min/{1.73_m2} (ref >59)
Globulin, Total: 2.6 g/dL (ref 1.5–4.5)
Glucose: 88 mg/dL (ref 65–99)
Potassium: 4.4 mmol/L (ref 3.5–5.2)
Sodium: 139 mmol/L (ref 134–144)
Total Protein: 7.2 g/dL (ref 6.0–8.5)

## 2019-06-12 LAB — LIPID PANEL W/O CHOL/HDL RATIO
Cholesterol, Total: 150 mg/dL (ref 100–199)
HDL: 54 mg/dL (ref >39)
LDL Chol Calc (NIH): 88 mg/dL (ref 0–99)
Triglycerides: 31 mg/dL (ref 0–149)
VLDL Cholesterol Cal: 8 mg/dL (ref 5–40)

## 2019-06-12 LAB — TSH: TSH: 0.771 u[IU]/mL (ref 0.450–4.500)

## 2019-09-27 DIAGNOSIS — Z23 Encounter for immunization: Secondary | ICD-10-CM | POA: Diagnosis not present

## 2019-09-27 DIAGNOSIS — Z00129 Encounter for routine child health examination without abnormal findings: Secondary | ICD-10-CM | POA: Diagnosis not present

## 2020-05-03 ENCOUNTER — Telehealth (INDEPENDENT_AMBULATORY_CARE_PROVIDER_SITE_OTHER): Payer: BC Managed Care – PPO | Admitting: Family Medicine

## 2020-05-03 ENCOUNTER — Encounter: Payer: Self-pay | Admitting: Family Medicine

## 2020-05-03 VITALS — BP 118/84 | HR 70 | Temp 98.3°F | Wt 154.0 lb

## 2020-05-03 DIAGNOSIS — B360 Pityriasis versicolor: Secondary | ICD-10-CM

## 2020-05-03 MED ORDER — KETOCONAZOLE 2 % EX SHAM: 1.0000 "application " | MEDICATED_SHAMPOO | CUTANEOUS | 0 refills | Status: DC

## 2020-05-03 NOTE — Progress Notes (Signed)
BP 118/84   Pulse 70   Temp 98.3 F (36.8 C) (Oral)   Wt 154 lb (69.9 kg)   SpO2 98%   BMI 25.26 kg/m    Subjective:    Patient ID: Dawn Morgan, female    DOB: [birth year], 34 y.o.   MRN: [id number]  HPI: Dawn Morgan is a 34 y.o. female  Chief Complaint  Patient presents with  . Rash    pt states she has had a rash on her back for the last couple of weeks. States that she was told in the past that it is from where she sweats   RASH Duration:  Few weeks  Location: back  Itching: no Burning: no Redness: no Oozing: no Scaling: no Blisters: no Painful: no Fevers: no Change in detergents/soaps/personal care products: no Recent illness: no Recent travel:no History of same: yes Context: stable Alleviating factors: nothing Treatments attempted:nothing Shortness of breath: no  Throat/tongue swelling: no Myalgias/arthralgias: no   Relevant past medical, surgical, family and social history reviewed and updated as indicated. Interim medical history since our last visit reviewed. Allergies and medications reviewed and updated.  Review of Systems  Constitutional: Negative.   Respiratory: Negative.   Cardiovascular: Negative.   Gastrointestinal: Negative.   Musculoskeletal: Negative.   Skin: Positive for rash. Negative for color change, pallor and wound.  Neurological: Negative.   Psychiatric/Behavioral: Negative.     Per HPI unless specifically indicated above     Objective:    BP 118/84   Pulse 70   Temp 98.3 F (36.8 C) (Oral)   Wt 154 lb (69.9 kg)   SpO2 98%   BMI 25.26 kg/m   Wt Readings from Last 3 Encounters:  05/03/20 154 lb (69.9 kg)  06/11/19 156 lb 4 oz (70.9 kg)  06/05/18 150 lb 1 oz (68.1 kg)    Physical Exam Vitals and nursing note reviewed.  Constitutional:      General: She is not in acute distress.    Appearance: Normal appearance. She is not ill-appearing, toxic-appearing or diaphoretic.  HENT:     Head: Normocephalic and  atraumatic.     Right Ear: External ear normal.     Left Ear: External ear normal.     Nose: Nose normal.     Mouth/Throat:     Mouth: Mucous membranes are moist.     Pharynx: Oropharynx is clear.  Eyes:     General: No scleral icterus.       Right eye: No discharge.        Left eye: No discharge.     Extraocular Movements: Extraocular movements intact.     Conjunctiva/sclera: Conjunctivae normal.     Pupils: Pupils are equal, round, and reactive to light.  Cardiovascular:     Rate and Rhythm: Normal rate and regular rhythm.     Pulses: Normal pulses.     Heart sounds: Normal heart sounds. No murmur heard.  No friction rub. No gallop.   Pulmonary:     Effort: Pulmonary effort is normal. No respiratory distress.     Breath sounds: Normal breath sounds. No stridor. No wheezing, rhonchi or rales.  Chest:     Chest wall: No tenderness.  Musculoskeletal:        General: Normal range of motion.     Cervical back: Normal range of motion and neck supple.  Skin:    General: Skin is warm and dry.     Capillary Refill: Capillary refill takes less than  2 seconds.     Coloration: Skin is not jaundiced or pale.     Findings: Rash present. No bruising, erythema or lesion.     Comments: Hyperpigmented rash on back  Neurological:     General: No focal deficit present.     Mental Status: She is alert and oriented to person, place, and time. Mental status is at baseline.  Psychiatric:        Mood and Affect: Mood normal.        Behavior: Behavior normal.        Thought Content: Thought content normal.        Judgment: Judgment normal.     Results for orders placed or performed in visit on 06/11/19  CBC with Differential/Platelet  Result Value Ref Range   WBC 3.2 (L) 3.4 - 10.8 x10E3/uL   RBC 4.80 3.77 - 5.28 x10E6/uL   Hemoglobin 13.1 11.1 - 15.9 g/dL   Hematocrit 69.6 29.5 - 46.6 %   MCV 85 79 - 97 fL   MCH 27.3 26.6 - 33.0 pg   MCHC 32.3 31 - 35 g/dL   RDW 28.4 13.2 - 44.0 %    Platelets 266 150 - 450 x10E3/uL   Neutrophils 43 Not Estab. %   Lymphs 45 Not Estab. %   Monocytes 10 Not Estab. %   Eos 1 Not Estab. %   Basos 1 Not Estab. %   Neutrophils Absolute 1.4 1 - 7 x10E3/uL   Lymphocytes Absolute 1.5 0 - 3 x10E3/uL   Monocytes Absolute 0.3 0 - 0 x10E3/uL   EOS (ABSOLUTE) 0.0 0.0 - 0.4 x10E3/uL   Basophils Absolute 0.0 0 - 0 x10E3/uL   Immature Granulocytes 0 Not Estab. %   Immature Grans (Abs) 0.0 0.0 - 0.1 x10E3/uL  Comprehensive metabolic panel  Result Value Ref Range   Glucose 88 65 - 99 mg/dL   BUN 11 6 - 20 mg/dL   Creatinine, Ser 1.02 0.57 - 1.00 mg/dL   GFR calc non Af Amer 87 >59 mL/min/1.73   GFR calc Af Amer 100 >59 mL/min/1.73   BUN/Creatinine Ratio 13 9 - 23   Sodium 139 134 - 144 mmol/L   Potassium 4.4 3.5 - 5.2 mmol/L   Chloride 104 96 - 106 mmol/L   CO2 23 20 - 29 mmol/L   Calcium 9.3 8.7 - 10.2 mg/dL   Total Protein 7.2 6.0 - 8.5 g/dL   Albumin 4.6 3.8 - 4.8 g/dL   Globulin, Total 2.6 1.5 - 4.5 g/dL   Albumin/Globulin Ratio 1.8 1.2 - 2.2   Bilirubin Total 0.8 0.0 - 1.2 mg/dL   Alkaline Phosphatase 51 39 - 117 IU/L   AST 23 0 - 40 IU/L   ALT 33 (H) 0 - 32 IU/L  Lipid Panel w/o Chol/HDL Ratio  Result Value Ref Range   Cholesterol, Total 150 100 - 199 mg/dL   Triglycerides 31 0 - 149 mg/dL   HDL 54 >72 mg/dL   VLDL Cholesterol Cal 8 5 - 40 mg/dL   LDL Chol Calc (NIH) 88 0 - 99 mg/dL  TSH  Result Value Ref Range   TSH 0.771 0.450 - 4.500 uIU/mL  UA/M w/rflx Culture, Routine   Specimen: Urine   URINE  Result Value Ref Range   Specific Gravity, UA 1.020 1.005 - 1.030   pH, UA 7.0 5.0 - 7.5   Color, UA Yellow Yellow   Appearance Ur Clear Clear   Leukocytes,UA Negative Negative  Protein,UA Negative Negative/Trace   Glucose, UA Negative Negative   Ketones, UA Negative Negative   RBC, UA Negative Negative   Bilirubin, UA Negative Negative   Urobilinogen, Ur 0.2 0.2 - 1.0 mg/dL   Nitrite, UA Negative Negative        Assessment & Plan:   Problem List Items Addressed This Visit    None    Visit Diagnoses    Tinea versicolor    -  Primary   Will treat with ketoconazole shampoo. Call with any concerns or if not improving.       Follow up plan: Return if symptoms worsen or fail to improve.

## 2020-05-27 ENCOUNTER — Other Ambulatory Visit: Payer: Self-pay | Admitting: Family Medicine

## 2020-05-27 NOTE — Telephone Encounter (Signed)
Requested Prescriptions  Pending Prescriptions Disp Refills   ketoconazole (NIZORAL) 2 % shampoo [Pharmacy Med Name: KETOCONAZOLE 2% SHAMPOO] 120 mL 0    Sig: APPLY 1 APPLICATION TOPICALLY 2 (TWO) TIMES A WEEK.     Over the Counter:  OTC Passed - 05/27/2020 12:46 AM      Passed - Valid encounter within last 12 months    Recent Outpatient Visits          3 weeks ago Tinea versicolor   [facility] [place], Megan P, DO   11 months ago Routine general medical examination at a health care facility   [facility], [place]ws, [place]   1 year ago Routine general medical examination at a health care facility   [facility], [place] P, DO   2 years ago Gastroesophageal reflux disease, esophagitis presence not specified   [facility] [place], Megan P, DO   2 years ago Routine general medical examination at a health care facility   [facility] [place], Oralia Rud, DO      Future Appointments            In 2 weeks Laural Benes, Oralia Rud, DO [facility], [facility]

## 2020-06-16 ENCOUNTER — Encounter: Payer: BC Managed Care – PPO | Admitting: Family Medicine

## 2020-06-27 ENCOUNTER — Other Ambulatory Visit: Payer: Self-pay | Admitting: Family Medicine

## 2020-06-27 NOTE — Telephone Encounter (Signed)
Requested Prescriptions  Pending Prescriptions Disp Refills   SRONYX 0.1-20 MG-MCG tablet [Pharmacy Med Name: SRONYX 0.10-0.02 MG TABLET] 84 tablet 0    Sig: TAKE 1 TABLET BY MOUTH EVERY DAY     OB/GYN:  Contraceptives Passed - 06/27/2020  1:23 AM      Passed - Last BP in normal range    BP Readings from Last 1 Encounters:  05/03/20 118/84         Passed - Valid encounter within last 12 months    Recent Outpatient Visits          1 month ago Tinea versicolor   [facility] [place], Megan P, DO   1 year ago Routine general medical examination at a health care facility   [facility], [place], [place]   2 years ago Routine general medical examination at a health care facility   [facility], [place] P, DO   2 years ago Gastroesophageal reflux disease, esophagitis presence not specified   [facility] [place], Megan P, DO   3 years ago Routine general medical examination at a health care facility   [facility] [place], [place], DO      Future Appointments            In 3 weeks Laural Benes, Oralia Rud, DO [facility], [facility]

## 2020-07-20 ENCOUNTER — Encounter: Payer: Self-pay | Admitting: Family Medicine

## 2020-08-15 ENCOUNTER — Encounter: Payer: Self-pay | Admitting: Family Medicine

## 2020-09-07 ENCOUNTER — Encounter: Payer: Self-pay | Admitting: Family Medicine

## 2020-09-13 ENCOUNTER — Other Ambulatory Visit: Payer: Self-pay | Admitting: Family Medicine

## 2020-09-13 NOTE — Telephone Encounter (Signed)
Refused by: Russell, Brittany N, CMA    Refusal reason: Patient has requested refill too soon   

## 2020-09-14 ENCOUNTER — Other Ambulatory Visit (HOSPITAL_COMMUNITY)
Admission: RE | Admit: 2020-09-14 | Discharge: 2020-09-14 | Disposition: A | Discharge status: Home / Self Care | Payer: BC Managed Care – PPO | Source: Ambulatory Visit | Attending: Family Medicine | Admitting: Family Medicine

## 2020-09-14 ENCOUNTER — Ambulatory Visit (INDEPENDENT_AMBULATORY_CARE_PROVIDER_SITE_OTHER): Payer: BC Managed Care – PPO | Admitting: Unknown Physician Specialty

## 2020-09-14 ENCOUNTER — Encounter: Payer: Self-pay | Admitting: Unknown Physician Specialty

## 2020-09-14 ENCOUNTER — Other Ambulatory Visit: Payer: Self-pay

## 2020-09-14 VITALS — BP 125/85 | HR 73 | Temp 99.0°F | Ht 65.67 in | Wt 161.0 lb

## 2020-09-14 DIAGNOSIS — Z23 Encounter for immunization: Secondary | ICD-10-CM

## 2020-09-14 DIAGNOSIS — Z0000 Encounter for general adult medical examination without abnormal findings: Secondary | ICD-10-CM | POA: Insufficient documentation

## 2020-09-14 MED ORDER — LEVONORGESTREL-ETHINYL ESTRAD 0.1-20 MG-MCG PO TABS
1.0000 | ORAL_TABLET | Freq: Every day | ORAL | 0 refills | Status: DC
Start: 2020-09-14 — End: 2020-12-12

## 2020-09-14 NOTE — Progress Notes (Signed)
BP 125/85   Pulse 73   Temp 99 F (37.2 C)   Ht 5' 5.67" (1.668 m)   Wt 161 lb (73 kg)   SpO2 100%   BMI 26.25 kg/m    Subjective:    Patient ID: Dawn Morgan, female    DOB: 08-30-1986, 34 y.o.   MRN: [id number]  HPI: Dawn Morgan is a 34 y.o. female  Chief Complaint  Patient presents with  . Annual Exam   Pt is here for general history and physical.  She walks for exercise. Tries to eat healthy most of the time.    Depression screen [facility] 2/9 06/11/2019 06/05/2018 11/03/2017 11/03/2017 02/04/2017  Decreased Interest 0 0 0 0 0  Down, Depressed, Hopeless 0 0 0 0 0  PHQ - 2 Score 0 0 0 0 0  Altered sleeping 0 0 0 0 -  Tired, decreased energy 0 0 0 1 -  Change in appetite 0 0 0 0 -  Feeling bad or failure about yourself  0 0 0 0 -  Trouble concentrating 0 0 0 0 -  Moving slowly or fidgety/restless 0 0 0 0 -  Suicidal thoughts 0 0 0 0 -  PHQ-9 Score 0 0 0 1 -  Difficult doing work/chores - Not difficult at all Not difficult at all - -     Relevant past medical, surgical, family and social history reviewed and updated as indicated. Interim medical history since our last visit reviewed. Allergies and medications reviewed and updated.  Review of Systems  Per HPI unless specifically indicated above     Objective:    BP 125/85   Pulse 73   Temp 99 F (37.2 C)   Ht 5' 5.67" (1.668 m)   Wt 161 lb (73 kg)   SpO2 100%   BMI 26.25 kg/m   Wt Readings from Last 3 Encounters:  09/14/20 161 lb (73 kg)  05/03/20 154 lb (69.9 kg)  06/11/19 156 lb 4 oz (70.9 kg)    Physical Exam Constitutional:      Appearance: She is well-developed and well-nourished.  HENT:     Head: Normocephalic and atraumatic.  Eyes:     General: No scleral icterus.       Right eye: No discharge.        Left eye: No discharge.     Pupils: Pupils are equal, round, and reactive to light.  Neck:     Thyroid: No thyromegaly.     Vascular: No carotid bruit.  Cardiovascular:     Rate and Rhythm: Normal  rate and regular rhythm.     Heart sounds: Normal heart sounds. No murmur heard. No friction rub. No gallop.   Pulmonary:     Effort: Pulmonary effort is normal. No respiratory distress.     Breath sounds: Normal breath sounds. No wheezing or rales.  Chest:  Breasts: No discharge from either breast. No tenderness.    Abdominal:     General: Bowel sounds are normal.     Palpations: Abdomen is soft.     Tenderness: There is no abdominal tenderness. There is no rebound.  Genitourinary:    Vagina: Normal.     Cervix: No cervical motion tenderness, discharge or friability.     Uterus: Normal.      Adnexa:        Right: No mass, tenderness or fullness.         Left: No mass, tenderness or fullness.    Musculoskeletal:  General: Normal range of motion.     Cervical back: Normal range of motion and neck supple.  Lymphadenopathy:     Cervical: No cervical adenopathy.  Skin:    General: Skin is warm, dry and intact.     Findings: No rash.  Neurological:     Mental Status: She is alert and oriented to person, place, and time.  Psychiatric:        Mood and Affect: Mood and affect normal.        Speech: Speech normal.        Behavior: Behavior normal.        Thought Content: Thought content normal.        Cognition and Memory: Cognition and memory normal.        Judgment: Judgment normal.     Results for orders placed or performed in visit on 06/11/19  CBC with Differential/Platelet  Result Value Ref Range   WBC 3.2 (L) 3.4 - 10.8 x10E3/uL   RBC 4.80 3.77 - 5.28 x10E6/uL   Hemoglobin 13.1 11.1 - 15.9 g/dL   Hematocrit 67.8 93.8 - 46.6 %   MCV 85 79 - 97 fL   MCH 27.3 26.6 - 33.0 pg   MCHC 32.3 31.5 - 35.7 g/dL   RDW 10.1 75.1 - 02.5 %   Platelets 266 150 - 450 x10E3/uL   Neutrophils 43 Not Estab. %   Lymphs 45 Not Estab. %   Monocytes 10 Not Estab. %   Eos 1 Not Estab. %   Basos 1 Not Estab. %   Neutrophils Absolute 1.4 1.4 - 7.0 x10E3/uL   Lymphocytes Absolute 1.5  0.7 - 3.1 x10E3/uL   Monocytes Absolute 0.3 0.1 - 0.9 x10E3/uL   EOS (ABSOLUTE) 0.0 0.0 - 0.4 x10E3/uL   Basophils Absolute 0.0 0.0 - 0.2 x10E3/uL   Immature Granulocytes 0 Not Estab. %   Immature Grans (Abs) 0.0 0.0 - 0.1 x10E3/uL  Comprehensive metabolic panel  Result Value Ref Range   Glucose 88 65 - 99 mg/dL   BUN 11 6 - 20 mg/dL   Creatinine, Ser 8.52 0.57 - 1.00 mg/dL   GFR calc non Af Amer 87 >59 mL/min/1.73   GFR calc Af Amer 100 >59 mL/min/1.73   BUN/Creatinine Ratio 13 9 - 23   Sodium 139 134 - 144 mmol/L   Potassium 4.4 3.5 - 5.2 mmol/L   Chloride 104 96 - 106 mmol/L   CO2 23 20 - 29 mmol/L   Calcium 9.3 8.7 - 10.2 mg/dL   Total Protein 7.2 6.0 - 8.5 g/dL   Albumin 4.6 3.8 - 4.8 g/dL   Globulin, Total 2.6 1.5 - 4.5 g/dL   Albumin/Globulin Ratio 1.8 1.2 - 2.2   Bilirubin Total 0.8 0.0 - 1.2 mg/dL   Alkaline Phosphatase 51 39 - 117 IU/L   AST 23 0 - 40 IU/L   ALT 33 (H) 0 - 32 IU/L  Lipid Panel w/o Chol/HDL Ratio  Result Value Ref Range   Cholesterol, Total 150 100 - 199 mg/dL   Triglycerides 31 0 - 149 mg/dL   HDL 54 >77 mg/dL   VLDL Cholesterol Cal 8 5 - 40 mg/dL   LDL Chol Calc (NIH) 88 0 - 99 mg/dL  TSH  Result Value Ref Range   TSH 0.771 0.450 - 4.500 uIU/mL  UA/M w/rflx Culture, Routine   Specimen: Urine   URINE  Result Value Ref Range   Specific Gravity, UA 1.020 1.005 - 1.030  pH, UA 7.0 5.0 - 7.5   Color, UA Yellow Yellow   Appearance Ur Clear Clear   Leukocytes,UA Negative Negative   Protein,UA Negative Negative/Trace   Glucose, UA Negative Negative   Ketones, UA Negative Negative   RBC, UA Negative Negative   Bilirubin, UA Negative Negative   Urobilinogen, Ur 0.2 0.2 - 1.0 mg/dL   Nitrite, UA Negative Negative      Assessment & Plan:   Problem List Items Addressed This Visit   None   Visit Diagnoses    Immunization due    -  Primary   Relevant Orders   Tdap vaccine greater than or equal to 7yo IM (Completed)   Flu Vaccine QUAD 6+  mos PF IM (Fluarix Quad PF) (Completed)   CBC with Differential/Platelet   Comprehensive metabolic panel   TSH   Lipid Panel w/o Chol/HDL Ratio   Routine general medical examination at a health care facility       Relevant Orders   CBC with Differential/Platelet   Comprehensive metabolic panel   TSH   Lipid Panel w/o Chol/HDL Ratio   Cytology - PAP       Follow up plan: Return in about 1 year (around 09/14/2021).

## 2020-09-15 LAB — CBC WITH DIFFERENTIAL/PLATELET
Basophils Absolute: 0 10*3/uL (ref 0.0–0.2)
Basos: 1 %
EOS (ABSOLUTE): 0 10*3/uL (ref 0.0–0.4)
Eos: 1 %
Hematocrit: 38.3 % (ref 34.0–46.6)
Hemoglobin: 12.6 g/dL (ref 11.1–15.9)
Immature Grans (Abs): 0 10*3/uL (ref 0.0–0.1)
Immature Granulocytes: 0 %
Lymphocytes Absolute: 2.6 10*3/uL (ref 0.7–3.1)
Lymphs: 45 %
MCH: 26.8 pg (ref 26.6–33.0)
MCHC: 32.9 g/dL (ref 31.5–35.7)
MCV: 81 fL (ref 79–97)
Monocytes Absolute: 0.5 10*3/uL (ref 0.1–0.9)
Monocytes: 8 %
Neutrophils Absolute: 2.6 10*3/uL (ref 1.4–7.0)
Neutrophils: 45 %
Platelets: 322 10*3/uL (ref 150–450)
RBC: 4.71 x10E6/uL (ref 3.77–5.28)
RDW: 13.4 % (ref 11.7–15.4)
WBC: 5.7 10*3/uL (ref 3.4–10.8)

## 2020-09-15 LAB — TSH: TSH: 0.678 u[IU]/mL (ref 0.450–4.500)

## 2020-09-15 LAB — COMPREHENSIVE METABOLIC PANEL
ALT: 41 IU/L — ABNORMAL HIGH (ref 0–32)
AST: 25 IU/L (ref 0–40)
Albumin/Globulin Ratio: 1.9 (ref 1.2–2.2)
Albumin: 4.8 g/dL (ref 3.8–4.8)
Alkaline Phosphatase: 59 IU/L (ref 44–121)
BUN/Creatinine Ratio: 11 (ref 9–23)
BUN: 10 mg/dL (ref 6–20)
Bilirubin Total: 0.5 mg/dL (ref 0.0–1.2)
CO2: 22 mmol/L (ref 20–29)
Calcium: 9.4 mg/dL (ref 8.7–10.2)
Chloride: 103 mmol/L (ref 96–106)
Creatinine, Ser: 0.89 mg/dL (ref 0.57–1.00)
GFR calc Af Amer: 96 mL/min/{1.73_m2} (ref >59)
GFR calc non Af Amer: 84 mL/min/{1.73_m2} (ref >59)
Globulin, Total: 2.5 g/dL (ref 1.5–4.5)
Glucose: 78 mg/dL (ref 65–99)
Potassium: 4.1 mmol/L (ref 3.5–5.2)
Sodium: 139 mmol/L (ref 134–144)
Total Protein: 7.3 g/dL (ref 6.0–8.5)

## 2020-09-15 LAB — LIPID PANEL W/O CHOL/HDL RATIO
Cholesterol, Total: 177 mg/dL (ref 100–199)
HDL: 55 mg/dL (ref >39)
LDL Chol Calc (NIH): 111 mg/dL — ABNORMAL HIGH (ref 0–99)
Triglycerides: 56 mg/dL (ref 0–149)
VLDL Cholesterol Cal: 11 mg/dL (ref 5–40)

## 2020-09-21 LAB — CYTOLOGY - PAP
Comment: NEGATIVE
Diagnosis: NEGATIVE
High risk HPV: NEGATIVE

## 2020-11-14 ENCOUNTER — Telehealth (INDEPENDENT_AMBULATORY_CARE_PROVIDER_SITE_OTHER): Payer: BC Managed Care – PPO | Admitting: Nurse Practitioner

## 2020-11-14 ENCOUNTER — Encounter: Payer: Self-pay | Admitting: Nurse Practitioner

## 2020-11-14 DIAGNOSIS — U071 COVID-19: Secondary | ICD-10-CM | POA: Insufficient documentation

## 2020-11-14 MED ORDER — PREDNISONE 20 MG PO TABS: 40.0000 mg | ORAL_TABLET | Freq: Every day | ORAL | 0 refills | Status: AC

## 2020-11-14 NOTE — Patient Instructions (Addendum)

## 2020-11-14 NOTE — Assessment & Plan Note (Signed)
Acute with symptoms starting 11/07/20, overall improving with mild cough remaining.  Is Covid vaccinated x 2, recommend once recovered to obtain booster.  At this time will send in script for Prednisone 40 MG daily x 5 days.  Recommend: - Increased rest - Increasing Fluids - Acetaminophen / ibuprofen as needed for fever/pain.  - Mucinex.  - Self quarantine for total of 10 days, until 11/20/20 (she tested positive 11/10/20). Return to office for any worsening or ongoing symptoms.

## 2020-11-14 NOTE — Progress Notes (Signed)
There were no vitals taken for this visit.   Subjective:    Patient ID: Dawn Morgan, female    DOB: [birth year], 35 y.o.   MRN: [id number]  HPI: Dawn Morgan is a 35 y.o. female  Chief Complaint  Patient presents with  . Covid Positive    Tested positive on 11/10/20 and still has a small cough. Patient would like to know if she could get an antibiotic for the cough.  One of her children also tested poisitive.   . This visit was completed via telephone due to the restrictions of the COVID-19 pandemic. All issues as above were discussed and addressed but no physical exam was performed. If it was felt that the patient should be evaluated in the office, they were directed there. The patient verbally consented to this visit. Patient was unable to complete an audio/visual visit due to Technical difficulties, Lack of internet. Due to the catastrophic nature of the COVID-19 pandemic, this visit was done through audio contact only. . Location of the patient: home . Location of the provider: work . Those involved with this call:  . Provider: Aura Dials, DNP . CMA: Tristan Schroeder, CMA . Front Desk/Registration: Harriet Pho  . Time spent on call: 21 minutes on the phone discussing health concerns. 15 minutes total spent in review of patient's record and preparation of their chart.   COVID INFECTION  Tested positive for Covid on 11/10/2020.  Reports overall feeling better, some small cough.  One of her children had Covid.  Has been vaccinated for Covid x 2.  She reports symptoms were present all last week -- started with symptoms 11/07/20.  Denies loss of taste or smell.   Fever: no Cough: yes Shortness of breath: no Wheezing: no Chest pain: no Chest tightness: no Chest congestion: no Nasal congestion: no Runny nose: no Post nasal drip: no Sneezing: no Sore throat: no Swollen glands: no Sinus pressure: no Headache: no Face pain: no Toothache: no Ear pain: none Ear pressure:  none Eyes red/itching: none Eye drainage/crusting: no  Vomiting: no Rash: no Fatigue: yes Sick contacts: yes Strep contacts: no  Context: stable Recurrent sinusitis: no Relief with OTC cold/cough medications: yes  Treatments attempted: cold/sinus and cough syrup   Relevant past medical, surgical, family and social history reviewed and updated as indicated. Interim medical history since our last visit reviewed. Allergies and medications reviewed and updated.  Review of Systems  Constitutional: Negative for activity change, appetite change, fatigue and fever.  HENT: Negative.   Eyes: Negative for pain and visual disturbance.  Respiratory: Positive for cough. Negative for chest tightness, shortness of breath and wheezing.   Cardiovascular: Negative for chest pain, palpitations and leg swelling.  Gastrointestinal: Negative.   Endocrine: Negative.   Musculoskeletal: Negative for myalgias.  Neurological: Negative for dizziness, numbness and headaches.  Psychiatric/Behavioral: Negative.     Per HPI unless specifically indicated above     Objective:    There were no vitals taken for this visit.  Wt Readings from Last 3 Encounters:  09/14/20 161 lb (73 kg)  05/03/20 154 lb (69.9 kg)  06/11/19 156 lb 4 oz (70.9 kg)    Briefly able to see patient via video, but disconnected due to no sound present 1/2 way through visit. Physical Exam Vitals and nursing note reviewed.  Constitutional:      General: She is awake. She is not in acute distress.    Appearance: She is well-developed. She is not ill-appearing.  HENT:  Head: Normocephalic.     Right Ear: Hearing normal.     Left Ear: Hearing normal.  Eyes:     General: Lids are normal.        Right eye: No discharge.        Left eye: No discharge.     Conjunctiva/sclera: Conjunctivae normal.  Pulmonary:     Effort: Pulmonary effort is normal. No accessory muscle usage or respiratory distress.  Musculoskeletal:     Cervical  back: Normal range of motion.  Neurological:     Mental Status: She is alert and oriented to person, place, and time.  Psychiatric:        Attention and Perception: Attention normal.        Mood and Affect: Mood normal.        Behavior: Behavior normal. Behavior is cooperative.        Thought Content: Thought content normal.        Judgment: Judgment normal.     Results for orders placed or performed in visit on 09/14/20  CBC with Differential/Platelet  Result Value Ref Range   WBC 5.7 3.4 - 10.8 x10E3/uL   RBC 4.71 3.77 - 5.28 x10E6/uL   Hemoglobin 12.6 11.1 - 15.9 g/dL   Hematocrit 70.9 62.8 - 46.6 %   MCV 81 79 - 97 fL   MCH 26.8 26.6 - 33.0 pg   MCHC 32.9 31.5 - 35.7 g/dL   RDW 36.6 29.4 - 76.5 %   Platelets 322 150 - 450 x10E3/uL   Neutrophils 45 Not Estab. %   Lymphs 45 Not Estab. %   Monocytes 8 Not Estab. %   Eos 1 Not Estab. %   Basos 1 Not Estab. %   Neutrophils Absolute 2.6 1.4 - 7.0 x10E3/uL   Lymphocytes Absolute 2.6 0.7 - 3.1 x10E3/uL   Monocytes Absolute 0.5 0.1 - 0.9 x10E3/uL   EOS (ABSOLUTE) 0.0 0.0 - 0.4 x10E3/uL   Basophils Absolute 0.0 0.0 - 0.2 x10E3/uL   Immature Granulocytes 0 Not Estab. %   Immature Grans (Abs) 0.0 0.0 - 0.1 x10E3/uL  Comprehensive metabolic panel  Result Value Ref Range   Glucose 78 65 - 99 mg/dL   BUN 10 6 - 20 mg/dL   Creatinine, Ser 4.65 0.57 - 1.00 mg/dL   GFR calc non Af Amer 84 >59 mL/min/1.73   GFR calc Af Amer 96 >59 mL/min/1.73   BUN/Creatinine Ratio 11 9 - 23   Sodium 139 134 - 144 mmol/L   Potassium 4.1 3.5 - 5.2 mmol/L   Chloride 103 96 - 106 mmol/L   CO2 22 20 - 29 mmol/L   Calcium 9.4 8.7 - 10.2 mg/dL   Total Protein 7.3 6.0 - 8.5 g/dL   Albumin 4.8 3.8 - 4.8 g/dL   Globulin, Total 2.5 1.5 - 4.5 g/dL   Albumin/Globulin Ratio 1.9 1.2 - 2.2   Bilirubin Total 0.5 0.0 - 1.2 mg/dL   Alkaline Phosphatase 59 44 - 121 IU/L   AST 25 0 - 40 IU/L   ALT 41 (H) 0 - 32 IU/L  TSH  Result Value Ref Range   TSH 0.678  0.450 - 4.500 uIU/mL  Lipid Panel w/o Chol/HDL Ratio  Result Value Ref Range   Cholesterol, Total 177 100 - 199 mg/dL   Triglycerides 56 0 - 149 mg/dL   HDL 55 >03 mg/dL   VLDL Cholesterol Cal 11 5 - 40 mg/dL   LDL Chol Calc (NIH) 546 (H) 0 -  99 mg/dL  Cytology - PAP  Result Value Ref Range   High risk HPV Negative    Adequacy      Satisfactory for evaluation; transformation zone component PRESENT.   Diagnosis      - Negative for intraepithelial lesion or malignancy (NILM)   Comment Normal Reference Range HPV - Negative       Assessment & Plan:   Problem List Items Addressed This Visit      Other   Lab test positive for detection of COVID-19 virus    Acute with symptoms starting 11/07/20, overall improving with mild cough remaining.  Is Covid vaccinated x 2, recommend once recovered to obtain booster.  At this time will send in script for Prednisone 40 MG daily x 5 days.  Recommend: - Increased rest - Increasing Fluids - Acetaminophen / ibuprofen as needed for fever/pain.  - Mucinex.  - Self quarantine for total of 10 days, until 11/20/20 (she tested positive 11/10/20). Return to office for any worsening or ongoing symptoms.          Follow up plan: Return if symptoms worsen or fail to improve.

## 2020-12-12 ENCOUNTER — Other Ambulatory Visit: Payer: Self-pay | Admitting: Unknown Physician Specialty

## 2021-08-27 ENCOUNTER — Other Ambulatory Visit: Payer: Self-pay | Admitting: Family Medicine

## 2021-08-27 MED ORDER — LEVONORGESTREL-ETHINYL ESTRAD 0.1-20 MG-MCG PO TABS
1.0000 | ORAL_TABLET | Freq: Every day | ORAL | 0 refills | Status: DC
Start: 2021-08-27 — End: 2021-11-15

## 2021-08-27 NOTE — Telephone Encounter (Signed)
Medication Refill - Medication: SRONYX 0.1-20 MG-MCG tablet  Has the patient contacted their pharmacy? Yes.   (Agent: If no, request that the patient contact the pharmacy for the refill. If patient does not wish to contact the pharmacy document the reason why and proceed with request.) (Agent: If yes, when and what did the pharmacy advise?)pharmacy has closed down and pt needs New Rx sent to a new pharmacy today   Preferred Pharmacy (with phone number or street name): [id number] - [place], [place] - [address] Has the patient been seen for an appointment in the last year OR does the patient have an upcoming appointment? Yes.    Agent: Please be advised that RX refills may take up to 3 business days. We ask that you follow-up with your pharmacy.

## 2021-08-27 NOTE — Telephone Encounter (Signed)
Requested Prescriptions  Pending Prescriptions Disp Refills  . levonorgestrel-ethinyl estradiol (SRONYX) 0.1-20 MG-MCG tablet 84 tablet 0    Sig: Take 1 tablet by mouth daily.     OB/GYN:  Contraceptives Passed - 08/27/2021  3:57 PM      Passed - Last BP in normal range    BP Readings from Last 1 Encounters:  09/14/20 125/85         Passed - Valid encounter within last 12 months    Recent Outpatient Visits          9 months ago Lab test positive for detection of COVID-19 virus   [facility] [place], Dorie Rank, NP   11 months ago Immunization due   [facility] Gabriel Cirri, NP   1 year ago Tinea versicolor   [facility] [place], [place], DO   2 years ago Routine general medical examination at a health care facility   [facility], Salley Hews, [place]   3 years ago Routine general medical examination at a health care facility   [facility] [place], Oralia Rud, DO      Future Appointments            In 3 weeks Laural Benes, Oralia Rud, DO [employer], [facility]

## 2021-09-21 ENCOUNTER — Other Ambulatory Visit: Payer: Self-pay

## 2021-09-21 ENCOUNTER — Encounter: Payer: Self-pay | Admitting: Family Medicine

## 2021-09-21 ENCOUNTER — Ambulatory Visit (INDEPENDENT_AMBULATORY_CARE_PROVIDER_SITE_OTHER): Payer: BC Managed Care – PPO | Admitting: Family Medicine

## 2021-09-21 VITALS — BP 100/67 | HR 84 | Temp 98.2°F | Ht 66.0 in | Wt 156.6 lb

## 2021-09-21 DIAGNOSIS — Z0000 Encounter for general adult medical examination without abnormal findings: Secondary | ICD-10-CM | POA: Diagnosis not present

## 2021-09-21 DIAGNOSIS — Z23 Encounter for immunization: Secondary | ICD-10-CM | POA: Diagnosis not present

## 2021-09-21 DIAGNOSIS — Z136 Encounter for screening for cardiovascular disorders: Secondary | ICD-10-CM | POA: Diagnosis not present

## 2021-09-21 NOTE — Patient Instructions (Signed)
[employer]vertising account planner Dermat[facility] [phone] [address] [place], [place] [address] [place]  [facility] [address] [place], [place] [address] Phone: [phone]

## 2021-09-21 NOTE — Progress Notes (Signed)
BP 100/67    Pulse 84    Temp 98.2 F (36.8 C)    Ht 5\' 6"  (1.676 m)    Wt 156 lb 9.6 oz (71 kg)    SpO2 99%    BMI 25.28 kg/m    Subjective:    Patient ID: Dawn Morgan, female    DOB: March 18, 1986, 35 y.o.   MRN: [id number]  HPI: Mercury Schulmeister is a 35 y.o. female presenting on 09/21/2021 for comprehensive medical examination. Current medical complaints include:none  She currently lives with: husband and kids Menopausal Symptoms: no  Depression Screen done today and results listed below:  Depression screen [facility] 2/9 09/21/2021 11/14/2020 06/11/2019 06/05/2018 11/03/2017  Decreased Interest 0 0 0 0 0  Down, Depressed, Hopeless 0 0 0 0 0  PHQ - 2 Score 0 0 0 0 0  Altered sleeping 0 - 0 0 0  Tired, decreased energy 0 - 0 0 0  Change in appetite 0 - 0 0 0  Feeling bad or failure about yourself  0 - 0 0 0  Trouble concentrating 0 - 0 0 0  Moving slowly or fidgety/restless 0 - 0 0 0  Suicidal thoughts 0 - 0 0 0  PHQ-9 Score 0 - 0 0 0  Difficult doing work/chores - - - Not difficult at all Not difficult at all    Past Medical History:  Past Medical History:  Diagnosis Date   Choledocholithiasis    Elevated LFTs    GERD (gastroesophageal reflux disease)    Lab test positive for detection of COVID-19 virus    Right upper quadrant abdominal pain    Sickle cell trait ([place])     Surgical History:  Past Surgical History:  Procedure Laterality Date   CHOLECYSTECTOMY N/A 03/05/2016   Procedure: LAPAROSCOPIC CHOLECYSTECTOMY WITH INTRAOPERATIVE CHOLANGIOGRAM;  Surgeon: Mickeal Skinner, MD;  Location: [place];  Service: General;  Laterality: N/A;   ERCP N/A 03/06/2016   Procedure: ENDOSCOPIC RETROGRADE CHOLANGIOPANCREATOGRAPHY (ERCP);  Surgeon: Ladene Artist, MD;  Location: [facility] ENDOSCOPY;  Service: Endoscopy;  Laterality: N/A;   NO PAST SURGERIES      Medications:  Current Outpatient Medications on File Prior to Visit  Medication Sig   acetaminophen (TYLENOL) 325 MG tablet Take 650 mg by  mouth every 6 (six) hours as needed.   levonorgestrel-ethinyl estradiol (SRONYX) 0.1-20 MG-MCG tablet Take 1 tablet by mouth daily.   ketoconazole (NIZORAL) 2 % shampoo APPLY 1 APPLICATION TOPICALLY 2 (TWO) TIMES A WEEK. (Patient not taking: Reported on 09/21/2021)   No current facility-administered medications on file prior to visit.    Allergies:  No Known Allergies  Social History:  Social History   Socioeconomic History   Marital status: Married    Spouse name: Okey Dupre   Number of children: 3   Years of education: Not on file   Highest education level: Not on file  Occupational History   Occupation: Theme park manager  Tobacco Use   Smoking status: Never   Smokeless tobacco: Never  Vaping Use   Vaping Use: Never used  Substance and Sexual Activity   Alcohol use: Yes    Comment: OCCASIONAL   Drug use: No   Sexual activity: Yes    Birth control/protection: Pill  Other Topics Concern   Not on file  Social History Narrative   Not on file   Social Determinants of Health   Financial Resource Strain: Not on file  Food Insecurity: Not on file  Transportation Needs: Not  on file  Physical Activity: Not on file  Stress: Not on file  Social Connections: Not on file  Intimate Partner Violence: Not on file   Social History   Tobacco Use  Smoking Status Never  Smokeless Tobacco Never   Social History   Substance and Sexual Activity  Alcohol Use Yes   Comment: OCCASIONAL    Family History:  Family History  Problem Relation Age of Onset   Diabetes Father    Arthritis Maternal Grandmother    Colon cancer Neg Hx     Past medical history, surgical history, medications, allergies, family history and social history reviewed with patient today and changes made to appropriate areas of the chart.   Review of Systems  Constitutional:  Positive for diaphoresis. Negative for chills, fever, malaise/fatigue and weight loss.  HENT: Negative.    Eyes: Negative.   Respiratory:  Negative.    Cardiovascular: Negative.   Gastrointestinal:  Positive for heartburn. Negative for abdominal pain, blood in stool, constipation, diarrhea, melena, nausea and vomiting.  Genitourinary: Negative.   Musculoskeletal: Negative.   Skin: Negative.   Neurological: Negative.   Endo/Heme/Allergies: Negative.   Psychiatric/Behavioral: Negative.    All other ROS negative except what is listed above and in the HPI.      Objective:    BP 100/67    Pulse 84    Temp 98.2 F (36.8 C)    Ht 5\' 6"  (1.676 m)    Wt 156 lb 9.6 oz (71 kg)    SpO2 99%    BMI 25.28 kg/m   Wt Readings from Last 3 Encounters:  09/21/21 156 lb 9.6 oz (71 kg)  09/14/20 161 lb (73 kg)  05/03/20 154 lb (69.9 kg)    Physical Exam Vitals and nursing note reviewed.  Constitutional:      General: She is not in acute distress.    Appearance: Normal appearance. She is not ill-appearing, toxic-appearing or diaphoretic.  HENT:     Head: Normocephalic and atraumatic.     Right Ear: Tympanic membrane, ear canal and external ear normal. There is no impacted cerumen.     Left Ear: Tympanic membrane, ear canal and external ear normal. There is no impacted cerumen.     Nose: Nose normal. No congestion or rhinorrhea.     Mouth/Throat:     Mouth: Mucous membranes are moist.     Pharynx: Oropharynx is clear. No oropharyngeal exudate or posterior oropharyngeal erythema.  Eyes:     General: No scleral icterus.       Right eye: No discharge.        Left eye: No discharge.     Extraocular Movements: Extraocular movements intact.     Conjunctiva/sclera: Conjunctivae normal.     Pupils: Pupils are equal, round, and reactive to light.  Neck:     Vascular: No carotid bruit.  Cardiovascular:     Rate and Rhythm: Normal rate and regular rhythm.     Pulses: Normal pulses.     Heart sounds: No murmur heard.   No friction rub. No gallop.  Pulmonary:     Effort: Pulmonary effort is normal. No respiratory distress.     Breath  sounds: Normal breath sounds. No stridor. No wheezing, rhonchi or rales.  Chest:     Chest wall: No tenderness.  Abdominal:     General: Abdomen is flat. Bowel sounds are normal. There is no distension.     Palpations: Abdomen is soft. There is no mass.  Tenderness: There is no abdominal tenderness. There is no right CVA tenderness, left CVA tenderness, guarding or rebound.     Hernia: No hernia is present.  Genitourinary:    Comments: Breast and pelvic exams deferred with shared decision making Musculoskeletal:        General: No swelling, tenderness, deformity or signs of injury.     Cervical back: Normal range of motion and neck supple. No rigidity. No muscular tenderness.     Right lower leg: No edema.     Left lower leg: No edema.  Lymphadenopathy:     Cervical: No cervical adenopathy.  Skin:    General: Skin is warm and dry.     Capillary Refill: Capillary refill takes less than 2 seconds.     Coloration: Skin is not jaundiced or pale.     Findings: No bruising, erythema, lesion or rash.  Neurological:     General: No focal deficit present.     Mental Status: She is alert and oriented to person, place, and time. Mental status is at baseline.     Cranial Nerves: No cranial nerve deficit.     Sensory: No sensory deficit.     Motor: No weakness.     Coordination: Coordination normal.     Gait: Gait normal.     Deep Tendon Reflexes: Reflexes normal.  Psychiatric:        Mood and Affect: Mood normal.        Behavior: Behavior normal.        Thought Content: Thought content normal.        Judgment: Judgment normal.    Results for orders placed or performed in visit on 09/14/20  CBC with Differential/Platelet  Result Value Ref Range   WBC 5.7 3.4 - 10.8 x10E3/uL   RBC 4.71 3.77 - 5.28 x10E6/uL   Hemoglobin 12.6 11.1 - 15.9 g/dL   Hematocrit 38.3 34.0 - 46.6 %   MCV 81 79 - 97 fL   MCH 26.8 26.6 - 33.0 pg   MCHC 32.9 31.5 - 35.7 g/dL   RDW 13.4 11.7 - 15.4 %    Platelets 322 150 - 450 x10E3/uL   Neutrophils 45 Not Estab. %   Lymphs 45 Not Estab. %   Monocytes 8 Not Estab. %   Eos 1 Not Estab. %   Basos 1 Not Estab. %   Neutrophils Absolute 2.6 1.4 - 7.0 x10E3/uL   Lymphocytes Absolute 2.6 0.7 - 3.1 x10E3/uL   Monocytes Absolute 0.5 0.1 - 0.9 x10E3/uL   EOS (ABSOLUTE) 0.0 0.0 - 0.4 x10E3/uL   Basophils Absolute 0.0 0.0 - 0.2 x10E3/uL   Immature Granulocytes 0 Not Estab. %   Immature Grans (Abs) 0.0 0.0 - 0.1 x10E3/uL  Comprehensive metabolic panel  Result Value Ref Range   Glucose 78 65 - 99 mg/dL   BUN 10 6 - 20 mg/dL   Creatinine, Ser 0.89 0.57 - 1.00 mg/dL   GFR calc non Af Amer 84 >59 mL/min/1.73   GFR calc Af Amer 96 >59 mL/min/1.73   BUN/Creatinine Ratio 11 9 - 23   Sodium 139 134 - 144 mmol/L   Potassium 4.1 3.5 - 5.2 mmol/L   Chloride 103 96 - 106 mmol/L   CO2 22 20 - 29 mmol/L   Calcium 9.4 8.7 - 10.2 mg/dL   Total Protein 7.3 6.0 - 8.5 g/dL   Albumin 4.8 3.8 - 4.8 g/dL   Globulin, Total 2.5 1.5 - 4.5 g/dL   Albumin/Globulin  Ratio 1.9 1.2 - 2.2   Bilirubin Total 0.5 0.0 - 1.2 mg/dL   Alkaline Phosphatase 59 44 - 121 IU/L   AST 25 0 - 40 IU/L   ALT 41 (H) 0 - 32 IU/L  TSH  Result Value Ref Range   TSH 0.678 0.450 - 4.500 uIU/mL  Lipid Panel w/o Chol/HDL Ratio  Result Value Ref Range   Cholesterol, Total 177 100 - 199 mg/dL   Triglycerides 56 0 - 149 mg/dL   HDL 55 >39 mg/dL   VLDL Cholesterol Cal 11 5 - 40 mg/dL   LDL Chol Calc (NIH) 111 (H) 0 - 99 mg/dL  Cytology - PAP  Result Value Ref Range   High risk HPV Negative    Adequacy      Satisfactory for evaluation; transformation zone component PRESENT.   Diagnosis      - Negative for intraepithelial lesion or malignancy (NILM)   Comment Normal Reference Range HPV - Negative       Assessment & Plan:   Problem List Items Addressed This Visit   None Visit Diagnoses     Routine general medical examination at a health care facility    -  Primary   Vaccines up to  date. Screening labs checked today. Pap up to date. Continue diet and exercise. Call with any concerns.    Relevant Orders   CBC with Differential/Platelet   Comprehensive metabolic panel   Lipid Panel w/o Chol/HDL Ratio   Urinalysis, Routine w reflex microscopic   TSH   Need for influenza vaccination       Relevant Orders   Flu Vaccine QUAD 6+ mos PF IM (Fluarix Quad PF) (Completed)        Follow up plan: Return in about 1 year (around 09/21/2022) for physical .   LABORATORY TESTING:  - Pap smear: not applicable  IMMUNIZATIONS:   - Tdap: Tetanus vaccination status reviewed: last tetanus booster within 10 years. - Influenza: Administered today - Pneumovax: Not applicable - Prevnar: Not applicable - COVID: Up to date   PATIENT COUNSELING:   Advised to take 1 mg of folate supplement per day if capable of pregnancy.   Sexuality: Discussed sexually transmitted diseases, partner selection, use of condoms, avoidance of unintended pregnancy  and contraceptive alternatives.   Advised to avoid cigarette smoking.  I discussed with the patient that most people either abstain from alcohol or drink within safe limits (<=14/week and <=4 drinks/occasion for males, <=7/weeks and <= 3 drinks/occasion for females) and that the risk for alcohol disorders and other health effects rises proportionally with the number of drinks per week and how often a drinker exceeds daily limits.  Discussed cessation/primary prevention of drug use and availability of treatment for abuse.   Diet: Encouraged to adjust caloric intake to maintain  or achieve ideal body weight, to reduce intake of dietary saturated fat and total fat, to limit sodium intake by avoiding high sodium foods and not adding table salt, and to maintain adequate dietary potassium and calcium preferably from fresh fruits, vegetables, and low-fat dairy products.    stressed the importance of regular exercise  Injury prevention: Discussed  safety belts, safety helmets, smoke detector, smoking near bedding or upholstery.   Dental health: Discussed importance of regular tooth brushing, flossing, and dental visits.    NEXT PREVENTATIVE PHYSICAL DUE IN 1 YEAR. Return in about 1 year (around 09/21/2022) for physical .

## 2021-09-22 LAB — COMPREHENSIVE METABOLIC PANEL
ALT: 45 IU/L — ABNORMAL HIGH (ref 0–32)
AST: 26 IU/L (ref 0–40)
Albumin/Globulin Ratio: 1.7 (ref 1.2–2.2)
Albumin: 4.7 g/dL (ref 3.8–4.8)
Alkaline Phosphatase: 81 IU/L (ref 44–121)
BUN/Creatinine Ratio: 15 (ref 9–23)
BUN: 13 mg/dL (ref 6–20)
Bilirubin Total: 0.6 mg/dL (ref 0.0–1.2)
CO2: 24 mmol/L (ref 20–29)
Calcium: 9.3 mg/dL (ref 8.7–10.2)
Chloride: 100 mmol/L (ref 96–106)
Creatinine, Ser: 0.85 mg/dL (ref 0.57–1.00)
Globulin, Total: 2.8 g/dL (ref 1.5–4.5)
Glucose: 79 mg/dL (ref 70–99)
Potassium: 4.6 mmol/L (ref 3.5–5.2)
Sodium: 138 mmol/L (ref 134–144)
Total Protein: 7.5 g/dL (ref 6.0–8.5)
eGFR: 90 mL/min/{1.73_m2} (ref >59)

## 2021-09-22 LAB — CBC WITH DIFFERENTIAL/PLATELET
Basophils Absolute: 0 10*3/uL (ref 0.0–0.2)
Basos: 1 %
EOS (ABSOLUTE): 0 10*3/uL (ref 0.0–0.4)
Eos: 1 %
Hematocrit: 42.2 % (ref 34.0–46.6)
Hemoglobin: 13.8 g/dL (ref 11.1–15.9)
Immature Grans (Abs): 0 10*3/uL (ref 0.0–0.1)
Immature Granulocytes: 0 %
Lymphocytes Absolute: 1.6 10*3/uL (ref 0.7–3.1)
Lymphs: 44 %
MCH: 27.4 pg (ref 26.6–33.0)
MCHC: 32.7 g/dL (ref 31.5–35.7)
MCV: 84 fL (ref 79–97)
Monocytes Absolute: 0.3 10*3/uL (ref 0.1–0.9)
Monocytes: 7 %
Neutrophils Absolute: 1.7 10*3/uL (ref 1.4–7.0)
Neutrophils: 47 %
Platelets: 392 10*3/uL (ref 150–450)
RBC: 5.04 x10E6/uL (ref 3.77–5.28)
RDW: 13.7 % (ref 11.7–15.4)
WBC: 3.7 10*3/uL (ref 3.4–10.8)

## 2021-09-22 LAB — LIPID PANEL W/O CHOL/HDL RATIO
Cholesterol, Total: 184 mg/dL (ref 100–199)
HDL: 47 mg/dL (ref >39)
LDL Chol Calc (NIH): 125 mg/dL — ABNORMAL HIGH (ref 0–99)
Triglycerides: 66 mg/dL (ref 0–149)
VLDL Cholesterol Cal: 12 mg/dL (ref 5–40)

## 2021-09-22 LAB — URINALYSIS, ROUTINE W REFLEX MICROSCOPIC
Bilirubin, UA: NEGATIVE
Glucose, UA: NEGATIVE
Ketones, UA: NEGATIVE
Leukocytes,UA: NEGATIVE
Nitrite, UA: NEGATIVE
Protein,UA: NEGATIVE
RBC, UA: NEGATIVE
Specific Gravity, UA: 1.017 (ref 1.005–1.030)
Urobilinogen, Ur: 1 mg/dL (ref 0.2–1.0)
pH, UA: 8 — ABNORMAL HIGH (ref 5.0–7.5)

## 2021-09-22 LAB — TSH: TSH: 0.63 u[IU]/mL (ref 0.450–4.500)

## 2021-11-15 ENCOUNTER — Other Ambulatory Visit: Payer: Self-pay | Admitting: Family Medicine

## 2021-11-15 NOTE — Telephone Encounter (Signed)
Requested Prescriptions  Pending Prescriptions Disp Refills   VIENVA 0.1-20 MG-MCG tablet [Pharmacy Med Name: VIENVA-28 TABLET] 84 tablet 2    Sig: TAKE 1 TABLET BY MOUTH EVERY DAY     OB/GYN:  Contraceptives Passed - 11/15/2021  4:31 PM      Passed - Last BP in normal range    BP Readings from Last 1 Encounters:  09/21/21 100/67         Passed - Valid encounter within last 12 months    Recent Outpatient Visits          1 month ago Routine general medical examination at a health care facility   [facility], [place], DO   1 year ago Lab test positive for detection of COVID-19 virus   [facility] [place], Henrine Screws T, NP   1 year ago Immunization due   [place], NP   1 year ago [place], [place], DO   2 years ago Routine general medical examination at a health care facility   [facility], Lilia Argue, [place]      Future Appointments            In 10 months Wynetta Emery, Barb Merino, DO [employer], [place] - Patient is not a smoker

## 2022-09-23 ENCOUNTER — Encounter: Payer: Self-pay | Admitting: Family Medicine

## 2022-09-26 ENCOUNTER — Ambulatory Visit (INDEPENDENT_AMBULATORY_CARE_PROVIDER_SITE_OTHER): Payer: BC Managed Care – PPO | Admitting: Family Medicine

## 2022-09-26 ENCOUNTER — Encounter: Payer: Self-pay | Admitting: Family Medicine

## 2022-09-26 VITALS — BP 109/76 | HR 66 | Temp 98.4°F | Ht 67.0 in | Wt 166.8 lb

## 2022-09-26 DIAGNOSIS — Z0000 Encounter for general adult medical examination without abnormal findings: Secondary | ICD-10-CM

## 2022-09-26 LAB — URINALYSIS, ROUTINE W REFLEX MICROSCOPIC
Bilirubin, UA: NEGATIVE
Glucose, UA: NEGATIVE
Ketones, UA: NEGATIVE
Leukocytes,UA: NEGATIVE
Nitrite, UA: NEGATIVE
Protein,UA: NEGATIVE
RBC, UA: NEGATIVE
Specific Gravity, UA: 1.02 (ref 1.005–1.030)
Urobilinogen, Ur: 1 mg/dL (ref 0.2–1.0)
pH, UA: 5.5 (ref 5.0–7.5)

## 2022-09-26 NOTE — Progress Notes (Signed)
BP 109/76   Pulse 66   Temp 98.4 F (36.9 C) (Oral)   Ht _0  (1.702 m)   Wt 166 lb 12.8 oz (75.7 kg)   SpO2 98%   BMI 26.12 kg/m    Subjective:    Patient ID: Dawn Morgan, female    DOB: 11/05/1985, 36 y.o.   MRN: [id number]  HPI: Dawn Morgan is a 36 y.o. female presenting on 09/26/2022 for comprehensive medical examination. Current medical complaints include:none  Menopausal Symptoms: yes  Depression Screen done today and results listed below:     09/26/2022    8:11 AM 09/21/2021   10:14 AM 11/14/2020    3:37 PM 06/11/2019    9:09 AM 06/05/2018   10:14 AM  Depression screen PHQ 2/9  Decreased Interest 0 0 0 0 0  Down, Depressed, Hopeless 0 0 0 0 0  PHQ - 2 Score 0 0 0 0 0  Altered sleeping 0 0  0 0  Tired, decreased energy 0 0  0 0  Change in appetite 0 0  0 0  Feeling bad or failure about yourself  0 0  0 0  Trouble concentrating 0 0  0 0  Moving slowly or fidgety/restless 0 0  0 0  Suicidal thoughts 0 0  0 0  PHQ-9 Score 0 0  0 0  Difficult doing work/chores Not difficult at all    Not difficult at all    Past Medical History:  Past Medical History:  Diagnosis Date   Choledocholithiasis    Elevated LFTs    GERD (gastroesophageal reflux disease)    Lab test positive for detection of COVID-19 virus    Right upper quadrant abdominal pain    Sickle cell trait ([place])     Surgical History:  Past Surgical History:  Procedure Laterality Date   CHOLECYSTECTOMY N/A 03/05/2016   Procedure: LAPAROSCOPIC CHOLECYSTECTOMY WITH INTRAOPERATIVE CHOLANGIOGRAM;  Surgeon: Mickeal Skinner, MD;  Location: [facility] [facility] OR;  Service: General;  Laterality: N/A;   ERCP N/A 03/06/2016   Procedure: ENDOSCOPIC RETROGRADE CHOLANGIOPANCREATOGRAPHY (ERCP);  Surgeon: Ladene Artist, MD;  Location: [facility] ENDOSCOPY;  Service: Endoscopy;  Laterality: N/A;   NO PAST SURGERIES      Medications:  Current Outpatient Medications on File Prior to Visit  Medication Sig   acetaminophen (TYLENOL) 325 MG  tablet Take 650 mg by mouth every 6 (six) hours as needed.   VIENVA 0.1-20 MG-MCG tablet TAKE 1 TABLET BY MOUTH EVERY DAY   ketoconazole (NIZORAL) 2 % shampoo APPLY 1 APPLICATION TOPICALLY 2 (TWO) TIMES A WEEK. (Patient not taking: Reported on 09/21/2021)   No current facility-administered medications on file prior to visit.    Allergies:  No Known Allergies  Social History:  Social History   Socioeconomic History   Marital status: Married    Spouse name: Okey Dupre   Number of children: 3   Years of education: Not on file   Highest education level: Not on file  Occupational History   Occupation: Theme park manager  Tobacco Use   Smoking status: Never   Smokeless tobacco: Never  Vaping Use   Vaping Use: Never used  Substance and Sexual Activity   Alcohol use: Yes    Comment: OCCASIONAL   Drug use: No   Sexual activity: Yes    Birth control/protection: Pill  Other Topics Concern   Not on file  Social History Narrative   Not on file   Social Determinants of Radio broadcast assistant  Strain: Not on file  Food Insecurity: Not on file  Transportation Needs: Not on file  Physical Activity: Not on file  Stress: Not on file  Social Connections: Not on file  Intimate Partner Violence: Not on file   Social History   Tobacco Use  Smoking Status Never  Smokeless Tobacco Never   Social History   Substance and Sexual Activity  Alcohol Use Yes   Comment: OCCASIONAL    Family History:  Family History  Problem Relation Age of Onset   Diabetes Father    Arthritis Maternal Grandmother    Colon cancer Neg Hx     Past medical history, surgical history, medications, allergies, family history and social history reviewed with patient today and changes made to appropriate areas of the chart.   Review of Systems  Constitutional: Negative.   HENT: Negative.    Eyes: Negative.   Respiratory: Negative.    Cardiovascular: Negative.   Gastrointestinal:  Positive for heartburn.  Negative for abdominal pain, blood in stool, constipation, diarrhea, melena, nausea and vomiting.  Genitourinary: Negative.   Musculoskeletal: Negative.   Skin: Negative.   Neurological: Negative.   Endo/Heme/Allergies: Negative.   Psychiatric/Behavioral: Negative.     All other ROS negative except what is listed above and in the HPI.      Objective:    BP 109/76   Pulse 66   Temp 98.4 F (36.9 C) (Oral)   Ht _0  (1.702 m)   Wt 166 lb 12.8 oz (75.7 kg)   SpO2 98%   BMI 26.12 kg/m   Wt Readings from Last 3 Encounters:  09/26/22 166 lb 12.8 oz (75.7 kg)  09/21/21 156 lb 9.6 oz (71 kg)  09/14/20 161 lb (73 kg)    Physical Exam Vitals and nursing note reviewed.  Constitutional:      General: She is not in acute distress.    Appearance: Normal appearance. She is not ill-appearing, toxic-appearing or diaphoretic.  HENT:     Head: Normocephalic and atraumatic.     Right Ear: Tympanic membrane, ear canal and external ear normal. There is no impacted cerumen.     Left Ear: Tympanic membrane, ear canal and external ear normal. There is no impacted cerumen.     Nose: Nose normal. No congestion or rhinorrhea.     Mouth/Throat:     Mouth: Mucous membranes are moist.     Pharynx: Oropharynx is clear. No oropharyngeal exudate or posterior oropharyngeal erythema.  Eyes:     General: No scleral icterus.       Right eye: No discharge.        Left eye: No discharge.     Extraocular Movements: Extraocular movements intact.     Conjunctiva/sclera: Conjunctivae normal.     Pupils: Pupils are equal, round, and reactive to light.  Neck:     Vascular: No carotid bruit.  Cardiovascular:     Rate and Rhythm: Normal rate and regular rhythm.     Pulses: Normal pulses.     Heart sounds: No murmur heard.    No friction rub. No gallop.  Pulmonary:     Effort: Pulmonary effort is normal. No respiratory distress.     Breath sounds: Normal breath sounds. No stridor. No wheezing, rhonchi or  rales.  Chest:     Chest wall: No tenderness.  Abdominal:     General: Abdomen is flat. Bowel sounds are normal. There is no distension.     Palpations: Abdomen is soft. There  is no mass.     Tenderness: There is no abdominal tenderness. There is no right CVA tenderness, left CVA tenderness, guarding or rebound.     Hernia: No hernia is present.  Genitourinary:    Comments: Breast and pelvic exams deferred with shared decision making Musculoskeletal:        General: No swelling, tenderness, deformity or signs of injury.     Cervical back: Normal range of motion and neck supple. No rigidity. No muscular tenderness.     Right lower leg: No edema.     Left lower leg: No edema.  Lymphadenopathy:     Cervical: No cervical adenopathy.  Skin:    General: Skin is warm and dry.     Capillary Refill: Capillary refill takes less than 2 seconds.     Coloration: Skin is not jaundiced or pale.     Findings: No bruising, erythema, lesion or rash.  Neurological:     General: No focal deficit present.     Mental Status: She is alert and oriented to person, place, and time. Mental status is at baseline.     Cranial Nerves: No cranial nerve deficit.     Sensory: No sensory deficit.     Motor: No weakness.     Coordination: Coordination normal.     Gait: Gait normal.     Deep Tendon Reflexes: Reflexes normal.  Psychiatric:        Mood and Affect: Mood normal.        Behavior: Behavior normal.        Thought Content: Thought content normal.        Judgment: Judgment normal.     Results for orders placed or performed in visit on 09/21/21  CBC with Differential/Platelet  Result Value Ref Range   WBC 3.7 3.4 - 10.8 x10E3/uL   RBC 5.04 3.77 - 5.28 x10E6/uL   Hemoglobin 13.8 11.1 - 15.9 g/dL   Hematocrit 42.2 34.0 - 46.6 %   MCV 84 79 - 97 fL   MCH 27.4 26.6 - 33.0 pg   MCHC 32.7 31.5 - 35.7 g/dL   RDW 13.7 11.7 - 15.4 %   Platelets 392 150 - 450 x10E3/uL   Neutrophils 47 Not Estab. %    Lymphs 44 Not Estab. %   Monocytes 7 Not Estab. %   Eos 1 Not Estab. %   Basos 1 Not Estab. %   Neutrophils Absolute 1.7 1.4 - 7.0 x10E3/uL   Lymphocytes Absolute 1.6 0.7 - 3.1 x10E3/uL   Monocytes Absolute 0.3 0.1 - 0.9 x10E3/uL   EOS (ABSOLUTE) 0.0 0.0 - 0.4 x10E3/uL   Basophils Absolute 0.0 0.0 - 0.2 x10E3/uL   Immature Granulocytes 0 Not Estab. %   Immature Grans (Abs) 0.0 0.0 - 0.1 x10E3/uL  Comprehensive metabolic panel  Result Value Ref Range   Glucose 79 70 - 99 mg/dL   BUN 13 6 - 20 mg/dL   Creatinine, Ser 0.85 0.57 - 1.00 mg/dL   eGFR 90 >59 mL/min/1.73   BUN/Creatinine Ratio 15 9 - 23   Sodium 138 134 - 144 mmol/L   Potassium 4.6 3.5 - 5.2 mmol/L   Chloride 100 96 - 106 mmol/L   CO2 24 20 - 29 mmol/L   Calcium 9.3 8.7 - 10.2 mg/dL   Total Protein 7.5 6.0 - 8.5 g/dL   Albumin 4.7 3.8 - 4.8 g/dL   Globulin, Total 2.8 1.5 - 4.5 g/dL   Albumin/Globulin Ratio 1.7 1.2 - 2.2  Bilirubin Total 0.6 0.0 - 1.2 mg/dL   Alkaline Phosphatase 81 44 - 121 IU/L   AST 26 0 - 40 IU/L   ALT 45 (H) 0 - 32 IU/L  Lipid Panel w/o Chol/HDL Ratio  Result Value Ref Range   Cholesterol, Total 184 100 - 199 mg/dL   Triglycerides 66 0 - 149 mg/dL   HDL 47 >39 mg/dL   VLDL Cholesterol Cal 12 5 - 40 mg/dL   LDL Chol Calc (NIH) 125 (H) 0 - 99 mg/dL  Urinalysis, Routine w reflex microscopic  Result Value Ref Range   Specific Gravity, UA 1.017 1.005 - 1.030   pH, UA 8.0 (H) 5.0 - 7.5   Color, UA Yellow Yellow   Appearance Ur Cloudy (A) Clear   Leukocytes,UA Negative Negative   Protein,UA Negative Negative/Trace   Glucose, UA Negative Negative   Ketones, UA Negative Negative   RBC, UA Negative Negative   Bilirubin, UA Negative Negative   Urobilinogen, Ur 1.0 0.2 - 1.0 mg/dL   Nitrite, UA Negative Negative   Microscopic Examination Comment   TSH  Result Value Ref Range   TSH 0.630 0.450 - 4.500 uIU/mL      Assessment & Plan:   Problem List Items Addressed This Visit   None Visit  Diagnoses     Routine general medical examination at a health care facility    -  Primary   Vaccines up to date. Screening labs checked today. Pap up to date. Continue diet and exercise. Call with any concerns.   Relevant Orders   CBC with Differential/Platelet   Comprehensive metabolic panel   Lipid Panel w/o Chol/HDL Ratio   Urinalysis, Routine w reflex microscopic   TSH   LH   FSH   Estradiol        Follow up plan: Return in about 1 year (around 09/27/2023), or physical.   LABORATORY TESTING:  - Pap smear: up to date  IMMUNIZATIONS:   - Tdap: Tetanus vaccination status reviewed: last tetanus booster within 10 years. - Influenza: Refused - Pneumovax: Not applicable - Prevnar: Not applicable - COVID: Up to date  PATIENT COUNSELING:   Advised to take 1 mg of folate supplement per day if capable of pregnancy.   Sexuality: Discussed sexually transmitted diseases, partner selection, use of condoms, avoidance of unintended pregnancy  and contraceptive alternatives.   Advised to avoid cigarette smoking.  I discussed with the patient that most people either abstain from alcohol or drink within safe limits (<=14/week and <=4 drinks/occasion for males, <=7/weeks and <= 3 drinks/occasion for females) and that the risk for alcohol disorders and other health effects rises proportionally with the number of drinks per week and how often a drinker exceeds daily limits.  Discussed cessation/primary prevention of drug use and availability of treatment for abuse.   Diet: Encouraged to adjust caloric intake to maintain  or achieve ideal body weight, to reduce intake of dietary saturated fat and total fat, to limit sodium intake by avoiding high sodium foods and not adding table salt, and to maintain adequate dietary potassium and calcium preferably from fresh fruits, vegetables, and low-fat dairy products.    stressed the importance of regular exercise  Injury prevention: Discussed safety  belts, safety helmets, smoke detector, smoking near bedding or upholstery.   Dental health: Discussed importance of regular tooth brushing, flossing, and dental visits.    NEXT PREVENTATIVE PHYSICAL DUE IN 1 YEAR. Return in about 1 year (around 09/27/2023), or physical.

## 2022-09-27 LAB — CBC WITH DIFFERENTIAL/PLATELET
Basophils Absolute: 0 10*3/uL (ref 0.0–0.2)
Basos: 1 %
EOS (ABSOLUTE): 0 10*3/uL (ref 0.0–0.4)
Eos: 1 %
Hematocrit: 39 % (ref 34.0–46.6)
Hemoglobin: 12.4 g/dL (ref 11.1–15.9)
Immature Grans (Abs): 0 10*3/uL (ref 0.0–0.1)
Immature Granulocytes: 0 %
Lymphocytes Absolute: 1.9 10*3/uL (ref 0.7–3.1)
Lymphs: 44 %
MCH: 26.4 pg — ABNORMAL LOW (ref 26.6–33.0)
MCHC: 31.8 g/dL (ref 31.5–35.7)
MCV: 83 fL (ref 79–97)
Monocytes Absolute: 0.3 10*3/uL (ref 0.1–0.9)
Monocytes: 7 %
Neutrophils Absolute: 2 10*3/uL (ref 1.4–7.0)
Neutrophils: 47 %
Platelets: 366 10*3/uL (ref 150–450)
RBC: 4.7 x10E6/uL (ref 3.77–5.28)
RDW: 13.6 % (ref 11.7–15.4)
WBC: 4.3 10*3/uL (ref 3.4–10.8)

## 2022-09-27 LAB — COMPREHENSIVE METABOLIC PANEL
ALT: 29 IU/L (ref 0–32)
AST: 25 IU/L (ref 0–40)
Albumin/Globulin Ratio: 1.7 (ref 1.2–2.2)
Albumin: 4.5 g/dL (ref 3.9–4.9)
Alkaline Phosphatase: 69 IU/L (ref 44–121)
BUN/Creatinine Ratio: 13 (ref 9–23)
BUN: 11 mg/dL (ref 6–20)
Bilirubin Total: 0.5 mg/dL (ref 0.0–1.2)
CO2: 23 mmol/L (ref 20–29)
Calcium: 9.3 mg/dL (ref 8.7–10.2)
Chloride: 104 mmol/L (ref 96–106)
Creatinine, Ser: 0.85 mg/dL (ref 0.57–1.00)
Globulin, Total: 2.6 g/dL (ref 1.5–4.5)
Glucose: 95 mg/dL (ref 70–99)
Potassium: 4.3 mmol/L (ref 3.5–5.2)
Sodium: 140 mmol/L (ref 134–144)
Total Protein: 7.1 g/dL (ref 6.0–8.5)
eGFR: 90 mL/min/{1.73_m2} (ref >59)

## 2022-09-27 LAB — LIPID PANEL W/O CHOL/HDL RATIO
Cholesterol, Total: 173 mg/dL (ref 100–199)
HDL: 57 mg/dL (ref >39)
LDL Chol Calc (NIH): 106 mg/dL — ABNORMAL HIGH (ref 0–99)
Triglycerides: 47 mg/dL (ref 0–149)
VLDL Cholesterol Cal: 10 mg/dL (ref 5–40)

## 2022-09-27 LAB — FOLLICLE STIMULATING HORMONE: FSH: 6.5 m[IU]/mL

## 2022-09-27 LAB — TSH: TSH: 0.805 u[IU]/mL (ref 0.450–4.500)

## 2022-09-27 LAB — LUTEINIZING HORMONE: LH: 7.2 m[IU]/mL

## 2022-09-27 LAB — ESTRADIOL: Estradiol: 60.9 pg/mL

## 2022-10-17 ENCOUNTER — Other Ambulatory Visit: Payer: Self-pay | Admitting: Family Medicine

## 2022-10-17 NOTE — Telephone Encounter (Signed)
Requested Prescriptions  Pending Prescriptions Disp Refills   VIENVA 0.1-20 MG-MCG tablet [Pharmacy Med Name: VIENVA-28 TABLET] 84 tablet 3    Sig: TAKE 1 TABLET BY MOUTH EVERY DAY     OB/GYN:  Contraceptives Passed - 10/17/2022  1:32 AM      Passed - Last BP in normal range    BP Readings from Last 1 Encounters:  09/26/22 109/76         Passed - Valid encounter within last 12 months    Recent Outpatient Visits           3 weeks ago Routine general medical examination at a health care facility   [facility], [place], DO   1 year ago Routine general medical examination at a health care facility   [facility] [place], DO   1 year ago Lab test positive for detection of COVID-19 virus   [facility] [place], Henrine Screws T, NP   2 years ago Immunization due   [facility] Kathrine Haddock, NP   2 years ago [place], [place], DO       Future Appointments             In 11 months Wynetta Emery, Barb Merino, DO [place], [place] - Patient is not a smoker

## 2023-08-26 ENCOUNTER — Encounter: Payer: Self-pay | Admitting: Family Medicine

## 2023-09-13 ENCOUNTER — Other Ambulatory Visit: Payer: Self-pay | Admitting: Family Medicine

## 2023-09-15 NOTE — Telephone Encounter (Signed)
Requested medications are due for refill today.  yes  Requested medications are on the active medications list.  unsure  Last refill.   Future visit scheduled.   yes  Notes to clinic.  Please review. Unsure if this is the same med as on med list.    Requested Prescriptions  Pending Prescriptions Disp Refills   SRONYX 0.1-20 MG-MCG tablet [Pharmacy Med Name: SRONYX 0.10-0.02 MG TABLET] 28 tablet 11    Sig: TAKE 1 TABLET BY MOUTH EVERY DAY     OB/GYN:  Contraceptives Passed - 09/13/2023  1:05 AM      Passed - Last BP in normal range    BP Readings from Last 1 Encounters:  09/26/22 109/76         Passed - Valid encounter within last 12 months    Recent Outpatient Visits           11 months ago Routine general medical examination at a health care facility   [facility] [place], [place] P, DO   1 year ago Routine general medical examination at a health care facility   [facility] [place], [place] P, DO   2 years ago Lab test positive for detection of COVID-19 virus   [facility] [place], Corrie Dandy T, NP   3 years ago Immunization due   [facility] [place] Cirri, NP   3 years ago Tinea versicolor   [place] [facility] [place], [place], DO       Future Appointments             In 3 weeks Dorcas Carrow, DO [place] [facility]            Passed - Patient is not a smoker

## 2023-09-29 ENCOUNTER — Encounter: Payer: BC Managed Care – PPO | Admitting: Family Medicine

## 2023-10-09 ENCOUNTER — Encounter: Payer: Self-pay | Admitting: Family Medicine

## 2023-10-09 ENCOUNTER — Ambulatory Visit (INDEPENDENT_AMBULATORY_CARE_PROVIDER_SITE_OTHER): Payer: BC Managed Care – PPO | Admitting: Family Medicine

## 2023-10-09 VITALS — BP 119/81 | HR 73 | Ht 66.0 in | Wt 163.8 lb

## 2023-10-09 DIAGNOSIS — Z0000 Encounter for general adult medical examination without abnormal findings: Secondary | ICD-10-CM

## 2023-10-09 DIAGNOSIS — Z1231 Encounter for screening mammogram for malignant neoplasm of breast: Secondary | ICD-10-CM | POA: Diagnosis not present

## 2023-10-09 LAB — BAYER DCA HB A1C WAIVED: HB A1C (BAYER DCA - WAIVED): 5.4 % (ref 4.8–5.6)

## 2023-10-09 MED ORDER — LEVONORGESTREL-ETHINYL ESTRAD 0.1-20 MG-MCG PO TABS: 1.0000 | ORAL_TABLET | Freq: Every day | ORAL | 4 refills | Status: DC

## 2023-10-09 MED ORDER — KETOCONAZOLE 2 % EX SHAM: 1.0000 | MEDICATED_SHAMPOO | CUTANEOUS | 3 refills | Status: AC

## 2023-10-09 NOTE — Progress Notes (Signed)
 BP 119/81   Pulse 73   Ht 5' 6 (1.676 m)   Wt 163 lb 12.8 oz (74.3 kg)   LMP 10/02/2023 (Approximate)   SpO2 98%   BMI 26.44 kg/m    Subjective:    Patient ID: Dawn Morgan, female    DOB: 1985-12-06, 38 y.o.   MRN: [id number]  HPI: Radonna Bracher is a 38 y.o. female presenting on 10/09/2023 for comprehensive medical examination. Current medical complaints include:none  She currently lives with: husband and kids Menopausal Symptoms: yes- has had some night sweats  Depression Screen done today and results listed below:     10/09/2023   10:53 AM 09/26/2022    8:11 AM 09/21/2021   10:14 AM 11/14/2020    3:37 PM 06/11/2019    9:09 AM  Depression screen PHQ 2/9  Decreased Interest 0 0 0 0 0  Down, Depressed, Hopeless 0 0 0 0 0  PHQ - 2 Score 0 0 0 0 0  Altered sleeping 0 0 0  0  Tired, decreased energy 0 0 0  0  Change in appetite 0 0 0  0  Feeling bad or failure about yourself  0 0 0  0  Trouble concentrating 0 0 0  0  Moving slowly or fidgety/restless 0 0 0  0  Suicidal thoughts 0 0 0  0  PHQ-9 Score 0 0 0  0  Difficult doing work/chores Not difficult at all Not difficult at all      Past Medical History:  Past Medical History:  Diagnosis Date   Choledocholithiasis    Common bile duct stone    Elevated LFTs    GERD (gastroesophageal reflux disease)    Lab test positive for detection of COVID-19 virus    Right upper quadrant abdominal pain    Sickle cell trait (HCC)     Surgical History:  Past Surgical History:  Procedure Laterality Date   CHOLECYSTECTOMY N/A 03/05/2016   Procedure: LAPAROSCOPIC CHOLECYSTECTOMY WITH INTRAOPERATIVE CHOLANGIOGRAM;  Surgeon: Herlene Beverley Bureau, MD;  Location: [facility] OR;  Service: General;  Laterality: N/A;   ERCP N/A 03/06/2016   Procedure: ENDOSCOPIC RETROGRADE CHOLANGIOPANCREATOGRAPHY (ERCP);  Surgeon: Gwendlyn ONEIDA Buddy, MD;  Location: [facility] ENDOSCOPY;  Service: Endoscopy;  Laterality: N/A;   NO PAST SURGERIES      Medications:  Current  Outpatient Medications on File Prior to Visit  Medication Sig   acetaminophen  (TYLENOL ) 325 MG tablet Take 650 mg by mouth every 6 (six) hours as needed.   No current facility-administered medications on file prior to visit.    Allergies:  No Known Allergies  Social History:  Social History   Socioeconomic History   Marital status: Married    Spouse name: Viann   Number of children: 3   Years of education: Not on file   Highest education level: Not on file  Occupational History   Occupation: Education Officer, Environmental  Tobacco Use   Smoking status: Never   Smokeless tobacco: Never  Vaping Use   Vaping status: Never Used  Substance and Sexual Activity   Alcohol use: Yes    Comment: OCCASIONAL   Drug use: No   Sexual activity: Yes    Birth control/protection: Pill  Other Topics Concern   Not on file  Social History Narrative   Not on file   Social Drivers of Health   Financial Resource Strain: Not on file  Food Insecurity: Not on file  Transportation Needs: Not on file  Physical Activity: Not  on file  Stress: Not on file  Social Connections: Not on file  Intimate Partner Violence: Not on file   Social History   Tobacco Use  Smoking Status Never  Smokeless Tobacco Never   Social History   Substance and Sexual Activity  Alcohol Use Yes   Comment: OCCASIONAL    Family History:  Family History  Problem Relation Age of Onset   Diabetes Father    Arthritis Maternal Grandmother    Colon cancer Neg Hx     Past medical history, surgical history, medications, allergies, family history and social history reviewed with patient today and changes made to appropriate areas of the chart.   Review of Systems  Constitutional:  Positive for diaphoresis. Negative for chills, fever, malaise/fatigue and weight loss.  HENT: Negative.    Eyes: Negative.   Respiratory: Negative.    Cardiovascular: Negative.   Gastrointestinal:  Positive for heartburn. Negative for abdominal pain, blood  in stool, constipation, diarrhea, melena, nausea and vomiting.  Genitourinary: Negative.   Musculoskeletal: Negative.   Skin: Negative.   Neurological: Negative.   Endo/Heme/Allergies:  Positive for polydipsia. Negative for environmental allergies. Does not bruise/bleed easily.  Psychiatric/Behavioral: Negative.     All other ROS negative except what is listed above and in the HPI.      Objective:    BP 119/81   Pulse 73   Ht 5' 6 (1.676 m)   Wt 163 lb 12.8 oz (74.3 kg)   LMP 10/02/2023 (Approximate)   SpO2 98%   BMI 26.44 kg/m   Wt Readings from Last 3 Encounters:  10/09/23 163 lb 12.8 oz (74.3 kg)  09/26/22 166 lb 12.8 oz (75.7 kg)  09/21/21 156 lb 9.6 oz (71 kg)    Physical Exam Vitals and nursing note reviewed.  Constitutional:      General: She is not in acute distress.    Appearance: Normal appearance. She is not ill-appearing, toxic-appearing or diaphoretic.  HENT:     Head: Normocephalic and atraumatic.     Right Ear: Tympanic membrane, ear canal and external ear normal. There is no impacted cerumen.     Left Ear: Tympanic membrane, ear canal and external ear normal. There is no impacted cerumen.     Nose: Nose normal. No congestion or rhinorrhea.     Mouth/Throat:     Mouth: Mucous membranes are moist.     Pharynx: Oropharynx is clear. No oropharyngeal exudate or posterior oropharyngeal erythema.  Eyes:     General: No scleral icterus.       Right eye: No discharge.        Left eye: No discharge.     Extraocular Movements: Extraocular movements intact.     Conjunctiva/sclera: Conjunctivae normal.     Pupils: Pupils are equal, round, and reactive to light.  Neck:     Vascular: No carotid bruit.  Cardiovascular:     Rate and Rhythm: Normal rate and regular rhythm.     Pulses: Normal pulses.     Heart sounds: No murmur heard.    No friction rub. No gallop.  Pulmonary:     Effort: Pulmonary effort is normal. No respiratory distress.     Breath sounds:  Normal breath sounds. No stridor. No wheezing, rhonchi or rales.  Chest:     Chest wall: No tenderness.  Abdominal:     General: Abdomen is flat. Bowel sounds are normal. There is no distension.     Palpations: Abdomen is soft. There is  no mass.     Tenderness: There is no abdominal tenderness. There is no right CVA tenderness, left CVA tenderness, guarding or rebound.     Hernia: No hernia is present.  Genitourinary:    Comments: Breast and pelvic exams deferred with shared decision making Musculoskeletal:        General: No swelling, tenderness, deformity or signs of injury.     Cervical back: Normal range of motion and neck supple. No rigidity. No muscular tenderness.     Right lower leg: No edema.     Left lower leg: No edema.  Lymphadenopathy:     Cervical: No cervical adenopathy.  Skin:    General: Skin is warm and dry.     Capillary Refill: Capillary refill takes less than 2 seconds.     Coloration: Skin is not jaundiced or pale.     Findings: No bruising, erythema, lesion or rash.  Neurological:     General: No focal deficit present.     Mental Status: She is alert and oriented to person, place, and time. Mental status is at baseline.     Cranial Nerves: No cranial nerve deficit.     Sensory: No sensory deficit.     Motor: No weakness.     Coordination: Coordination normal.     Gait: Gait normal.     Deep Tendon Reflexes: Reflexes normal.  Psychiatric:        Mood and Affect: Mood normal.        Behavior: Behavior normal.        Thought Content: Thought content normal.        Judgment: Judgment normal.     Results for orders placed or performed in visit on 09/26/22  Urinalysis, Routine w reflex microscopic   Collection Time: 09/26/22  8:25 AM  Result Value Ref Range   Specific Gravity, UA 1.020 1.005 - 1.030   pH, UA 5.5 5.0 - 7.5   Color, UA Yellow Yellow   Appearance Ur Clear Clear   Leukocytes,UA Negative Negative   Protein,UA Negative Negative/Trace    Glucose, UA Negative Negative   Ketones, UA Negative Negative   RBC, UA Negative Negative   Bilirubin, UA Negative Negative   Urobilinogen, Ur 1.0 0.2 - 1.0 mg/dL   Nitrite, UA Negative Negative   Microscopic Examination Comment   CBC with Differential/Platelet   Collection Time: 09/26/22  8:28 AM  Result Value Ref Range   WBC 4.3 3.4 - 10.8 x10E3/uL   RBC 4.70 3.77 - 5.28 x10E6/uL   Hemoglobin 12.4 11.1 - 15.9 g/dL   Hematocrit 60.9 65.9 - 46.6 %   MCV 83 79 - 97 fL   MCH 26.4 (L) 26.6 - 33.0 pg   MCHC 31.8 31.5 - 35.7 g/dL   RDW 86.3 88.2 - 84.5 %   Platelets 366 150 - 450 x10E3/uL   Neutrophils 47 Not Estab. %   Lymphs 44 Not Estab. %   Monocytes 7 Not Estab. %   Eos 1 Not Estab. %   Basos 1 Not Estab. %   Neutrophils Absolute 2.0 1.4 - 7.0 x10E3/uL   Lymphocytes Absolute 1.9 0.7 - 3.1 x10E3/uL   Monocytes Absolute 0.3 0.1 - 0.9 x10E3/uL   EOS (ABSOLUTE) 0.0 0.0 - 0.4 x10E3/uL   Basophils Absolute 0.0 0.0 - 0.2 x10E3/uL   Immature Granulocytes 0 Not Estab. %   Immature Grans (Abs) 0.0 0.0 - 0.1 x10E3/uL  Comprehensive metabolic panel   Collection Time: 09/26/22  8:28  AM  Result Value Ref Range   Glucose 95 70 - 99 mg/dL   BUN 11 6 - 20 mg/dL   Creatinine, Ser 9.14 0.57 - 1.00 mg/dL   eGFR 90 >40 fO/fpw/8.26   BUN/Creatinine Ratio 13 9 - 23   Sodium 140 134 - 144 mmol/L   Potassium 4.3 3.5 - 5.2 mmol/L   Chloride 104 96 - 106 mmol/L   CO2 23 20 - 29 mmol/L   Calcium 9.3 8.7 - 10.2 mg/dL   Total Protein 7.1 6.0 - 8.5 g/dL   Albumin 4.5 3.9 - 4.9 g/dL   Globulin, Total 2.6 1.5 - 4.5 g/dL   Albumin/Globulin Ratio 1.7 1.2 - 2.2   Bilirubin Total 0.5 0.0 - 1.2 mg/dL   Alkaline Phosphatase 69 44 - 121 IU/L   AST 25 0 - 40 IU/L   ALT 29 0 - 32 IU/L  Lipid Panel w/o Chol/HDL Ratio   Collection Time: 09/26/22  8:28 AM  Result Value Ref Range   Cholesterol, Total 173 100 - 199 mg/dL   Triglycerides 47 0 - 149 mg/dL   HDL 57 >60 mg/dL   VLDL Cholesterol Cal 10 5 - 40  mg/dL   LDL Chol Calc (NIH) 893 (H) 0 - 99 mg/dL  TSH   Collection Time: 09/26/22  8:28 AM  Result Value Ref Range   TSH 0.805 0.450 - 4.500 uIU/mL  LH   Collection Time: 09/26/22  8:28 AM  Result Value Ref Range   LH 7.2 mIU/mL  [facility]   Collection Time: 09/26/22  8:28 AM  Result Value Ref Range   FSH 6.5 mIU/mL  Estradiol    Collection Time: 09/26/22  8:28 AM  Result Value Ref Range   Estradiol  60.9 pg/mL      Assessment & Plan:   Problem List Items Addressed This Visit   None Visit Diagnoses       Routine general medical examination at a health care facility    -  Primary   Vaccines up to date/declined. Screening labs checked today. Pap up to date. Mammo ordered for June. Continue diet and exercise. Continue to monitor.   Relevant Orders   Bayer DCA Hb A1c Waived   CBC with Differential/Platelet   Comprehensive metabolic panel   Lipid Panel w/o Chol/HDL Ratio   TSH     Encounter for screening mammogram for malignant neoplasm of breast       Due in May. Ordered today.   Relevant Orders   MM 3D SCREENING MAMMOGRAM BILATERAL BREAST        Follow up plan: Return in about 1 year (around 10/08/2024) for physical.   LABORATORY TESTING:  - Pap smear: up to date  IMMUNIZATIONS:   - Tdap: Tetanus vaccination status reviewed: last tetanus booster within 10 years. - Influenza: Refused - Pneumovax: Not applicable - Prevnar: Not applicable - COVID: Refused - HPV: Not applicable - Shingrix vaccine: Not applicable  SCREENING: -Mammogram: Ordered today   PATIENT COUNSELING:   Advised to take 1 mg of folate supplement per day if capable of pregnancy.   Sexuality: Discussed sexually transmitted diseases, partner selection, use of condoms, avoidance of unintended pregnancy  and contraceptive alternatives.   Advised to avoid cigarette smoking.  I discussed with the patient that most people either abstain from alcohol or drink within safe limits (<=14/week and <=4  drinks/occasion for males, <=7/weeks and <= 3 drinks/occasion for females) and that the risk for alcohol disorders and other health effects rises  proportionally with the number of drinks per week and how often a drinker exceeds daily limits.  Discussed cessation/primary prevention of drug use and availability of treatment for abuse.   Diet: Encouraged to adjust caloric intake to maintain  or achieve ideal body weight, to reduce intake of dietary saturated fat and total fat, to limit sodium intake by avoiding high sodium foods and not adding table salt, and to maintain adequate dietary potassium and calcium preferably from fresh fruits, vegetables, and low-fat dairy products.    stressed the importance of regular exercise  Injury prevention: Discussed safety belts, safety helmets, smoke detector, smoking near bedding or upholstery.   Dental health: Discussed importance of regular tooth brushing, flossing, and dental visits.    NEXT PREVENTATIVE PHYSICAL DUE IN 1 YEAR. Return in about 1 year (around 10/08/2024) for physical.

## 2023-10-09 NOTE — Patient Instructions (Signed)
[place] °[employer] °Evening Primrose Oil °

## 2023-10-10 LAB — CBC WITH DIFFERENTIAL/PLATELET
Basophils Absolute: 0 10*3/uL (ref 0.0–0.2)
Basos: 1 %
EOS (ABSOLUTE): 0 10*3/uL (ref 0.0–0.4)
Eos: 1 %
Hematocrit: 39.3 % (ref 34.0–46.6)
Hemoglobin: 13 g/dL (ref 11.1–15.9)
Immature Grans (Abs): 0 10*3/uL (ref 0.0–0.1)
Immature Granulocytes: 0 %
Lymphocytes Absolute: 1.7 10*3/uL (ref 0.7–3.1)
Lymphs: 41 %
MCH: 27.8 pg (ref 26.6–33.0)
MCHC: 33.1 g/dL (ref 31.5–35.7)
MCV: 84 fL (ref 79–97)
Monocytes Absolute: 0.4 10*3/uL (ref 0.1–0.9)
Monocytes: 9 %
Neutrophils Absolute: 2.1 10*3/uL (ref 1.4–7.0)
Neutrophils: 48 %
Platelets: 337 10*3/uL (ref 150–450)
RBC: 4.68 x10E6/uL (ref 3.77–5.28)
RDW: 13.2 % (ref 11.7–15.4)
WBC: 4.3 10*3/uL (ref 3.4–10.8)

## 2023-10-10 LAB — COMPREHENSIVE METABOLIC PANEL
ALT: 38 [IU]/L — ABNORMAL HIGH (ref 0–32)
AST: 28 [IU]/L (ref 0–40)
Albumin: 4.4 g/dL (ref 3.9–4.9)
Alkaline Phosphatase: 64 [IU]/L (ref 44–121)
BUN/Creatinine Ratio: 12 (ref 9–23)
BUN: 10 mg/dL (ref 6–20)
Bilirubin Total: 0.8 mg/dL (ref 0.0–1.2)
CO2: 24 mmol/L (ref 20–29)
Calcium: 9 mg/dL (ref 8.7–10.2)
Chloride: 104 mmol/L (ref 96–106)
Creatinine, Ser: 0.84 mg/dL (ref 0.57–1.00)
Globulin, Total: 2.6 g/dL (ref 1.5–4.5)
Glucose: 91 mg/dL (ref 70–99)
Potassium: 4.4 mmol/L (ref 3.5–5.2)
Sodium: 140 mmol/L (ref 134–144)
Total Protein: 7 g/dL (ref 6.0–8.5)
eGFR: 91 mL/min/{1.73_m2} (ref >59)

## 2023-10-10 LAB — LIPID PANEL W/O CHOL/HDL RATIO
Cholesterol, Total: 170 mg/dL (ref 100–199)
HDL: 54 mg/dL (ref >39)
LDL Chol Calc (NIH): 106 mg/dL — ABNORMAL HIGH (ref 0–99)
Triglycerides: 49 mg/dL (ref 0–149)
VLDL Cholesterol Cal: 10 mg/dL (ref 5–40)

## 2023-10-10 LAB — TSH: TSH: 0.546 u[IU]/mL (ref 0.450–4.500)

## 2024-04-26 ENCOUNTER — Other Ambulatory Visit: Payer: Self-pay

## 2024-04-26 NOTE — Telephone Encounter (Signed)
 Copied from CRM [id number]. Topic: Clinical - Prescription Issue >> Apr 26, 2024 12:03 PM Sasha H wrote: Reason for CRM: Pt is wanting to know if she can have a prescription sent in for birth control she went out of town and forgot her and have not took it in 5 days and believes it has triggered her cycle.

## 2024-04-27 NOTE — Telephone Encounter (Signed)
 Sent message to patient, asking where she would like med to go

## 2024-04-27 NOTE — Telephone Encounter (Signed)
 Absolutely- where does she need it sent?

## 2024-04-28 MED ORDER — LEVONORGESTREL-ETHINYL ESTRAD 0.1-20 MG-MCG PO TABS: 1.0000 | ORAL_TABLET | Freq: Every day | ORAL | 4 refills | Status: AC

## 2024-04-29 ENCOUNTER — Other Ambulatory Visit: Payer: Self-pay

## 2024-07-23 ENCOUNTER — Encounter

## 2024-08-25 ENCOUNTER — Ambulatory Visit
Admission: RE | Admit: 2024-08-25 | Discharge: 2024-08-25 | Disposition: A | Discharge status: Home / Self Care | Source: Ambulatory Visit | Attending: Family Medicine | Admitting: Family Medicine

## 2024-08-25 DIAGNOSIS — Z1231 Encounter for screening mammogram for malignant neoplasm of breast: Secondary | ICD-10-CM | POA: Insufficient documentation

## 2024-08-30 ENCOUNTER — Ambulatory Visit: Payer: Self-pay | Admitting: Family Medicine

## 2024-10-19 ENCOUNTER — Encounter: Payer: Self-pay | Admitting: Family Medicine

## 2024-12-02 ENCOUNTER — Encounter: Admitting: Family Medicine
